# Patient Record
Sex: Male | Born: 1965 | Race: White | Hispanic: No | Marital: Married | Smoking: Never smoker
Health system: Southern US, Community
[De-identification: ages and names within clinical notes are randomized; demographics above are authoritative.]

## PROBLEM LIST (undated history)

## (undated) DIAGNOSIS — I10 Essential (primary) hypertension: Secondary | ICD-10-CM

## (undated) HISTORY — PX: NO PAST SURGERIES: SHX2092

## (undated) HISTORY — DX: Essential (primary) hypertension: I10

---

## 2014-01-26 LAB — HEPATIC FUNCTION PANEL
ALT: 29 U/L (ref 10–40)
AST: 20 U/L (ref 14–40)
Alkaline Phosphatase: 73 U/L (ref 25–125)
BILIRUBIN, TOTAL: 0.5 mg/dL

## 2014-01-26 LAB — BASIC METABOLIC PANEL
BUN: 9 mg/dL (ref 4–21)
Creatinine: 1 mg/dL (ref <=1.3)
Glucose: 100 mg/dL
Potassium: 4.3 mmol/L (ref 3.4–5.3)
SODIUM: 139 mmol/L (ref 137–147)

## 2014-01-26 LAB — CBC AND DIFFERENTIAL
HCT: 42 % (ref 41–53)
HEMOGLOBIN: 14.7 g/dL (ref 13.5–17.5)
Platelets: 230 10*3/uL (ref 150–399)
WBC: 6.9 10^3/mL

## 2014-01-27 ENCOUNTER — Ambulatory Visit: Payer: Self-pay | Admitting: Family Medicine

## 2014-01-27 IMAGING — US ABDOMEN ULTRASOUND
1 series · 14 of 25 positions shown · non-contrast
Comparison: None.

CLINICAL DATA: Epigastric abdominal pain for 10 days.

EXAM:
ULTRASOUND ABDOMEN COMPLETE

[Series 1: abdomen ultrasound · 0.40mm/px · 14 of 89 slices shown]
[im 1/89]
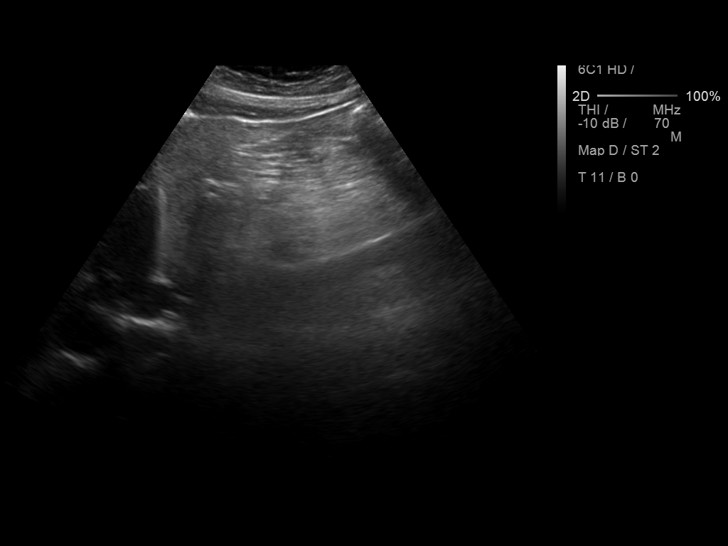
[im 8/89]
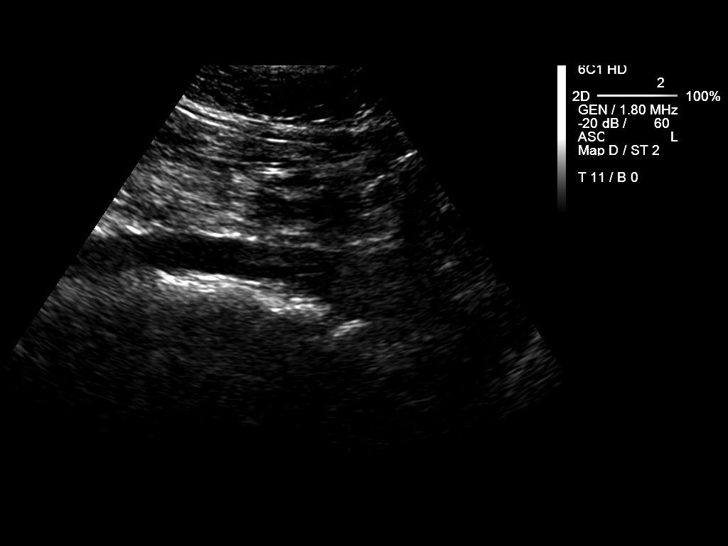
[im 15/89]
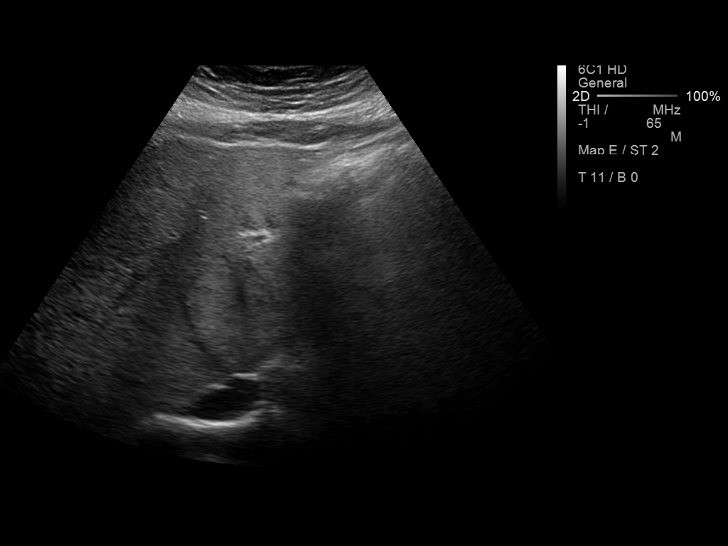
[im 23/89]
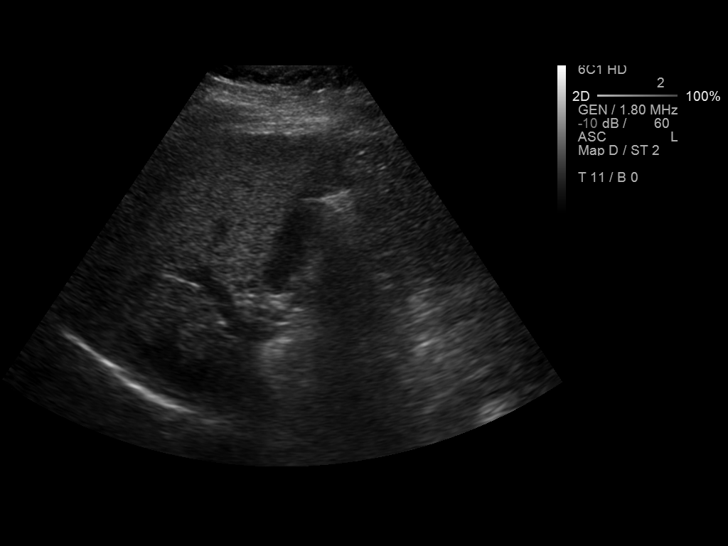
[im 30/89]
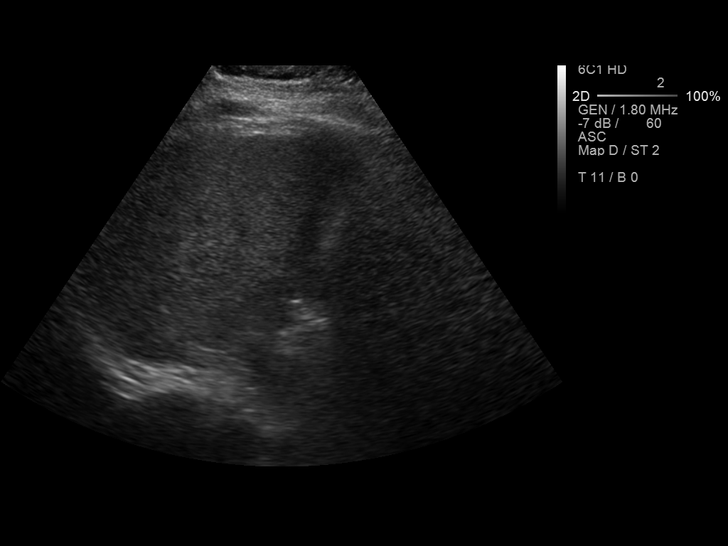
[im 34/89]
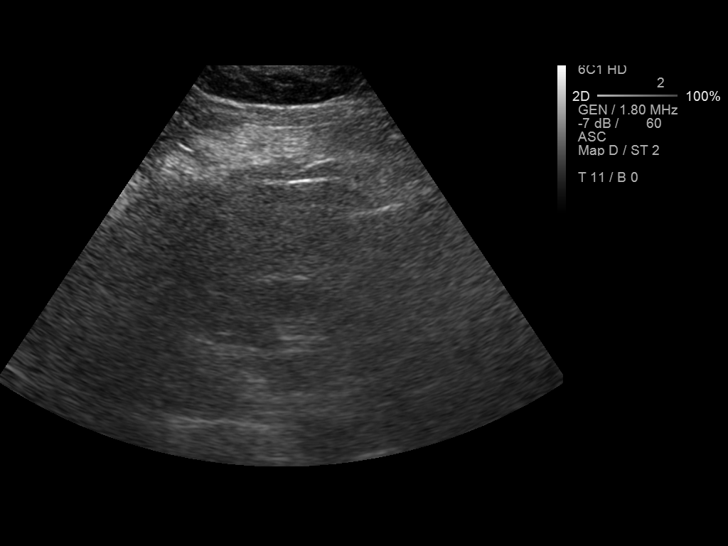
[im 41/89]
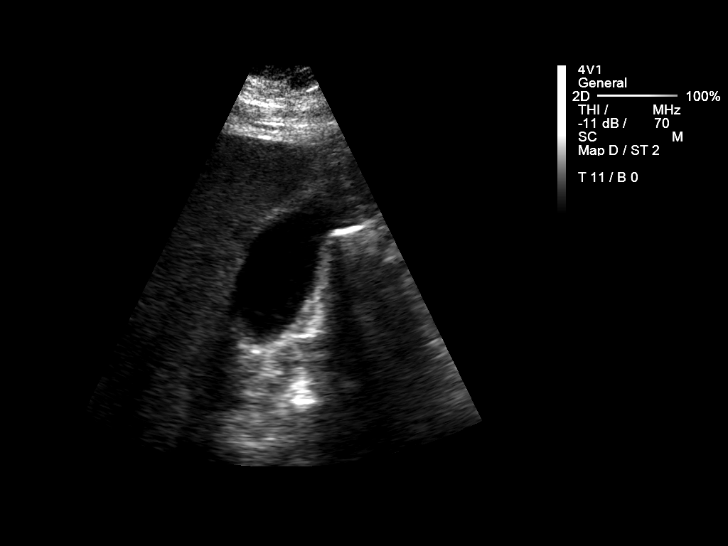
[im 48/89]
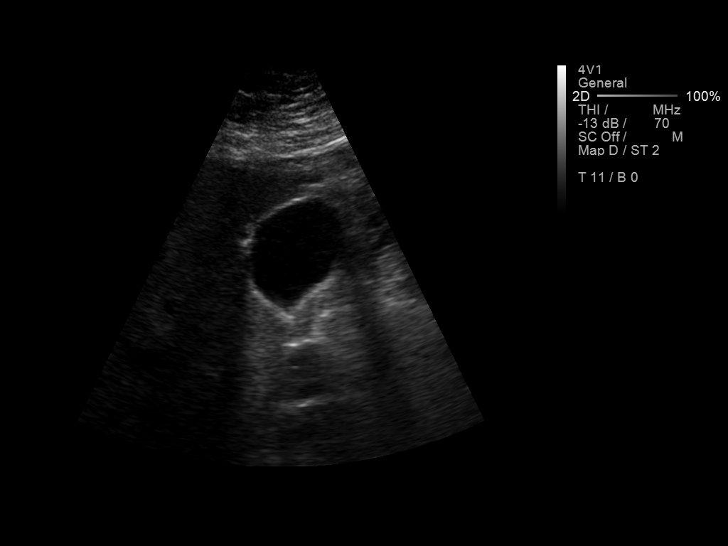
[im 56/89]
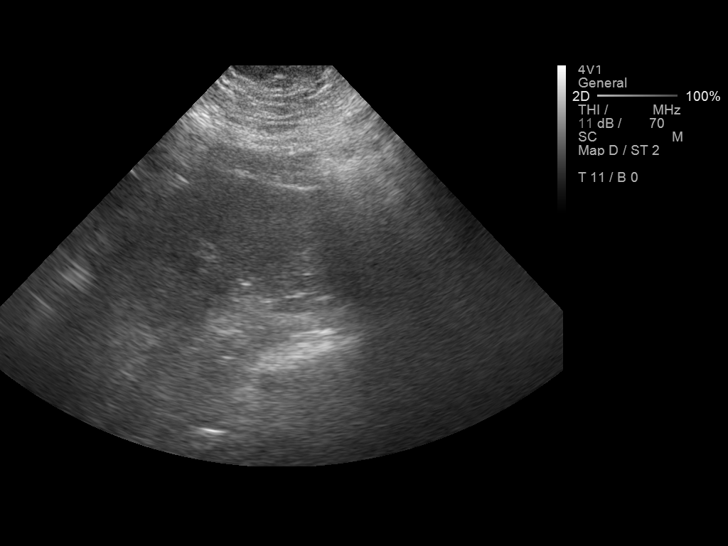
[im 59/89]
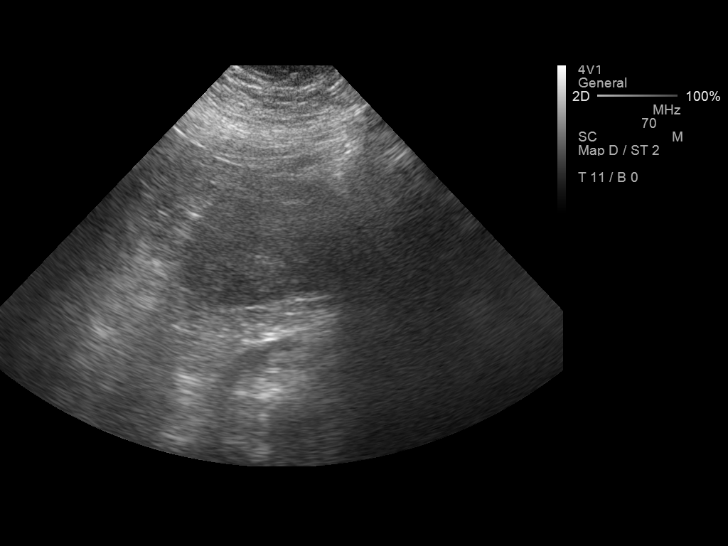
[im 67/89]
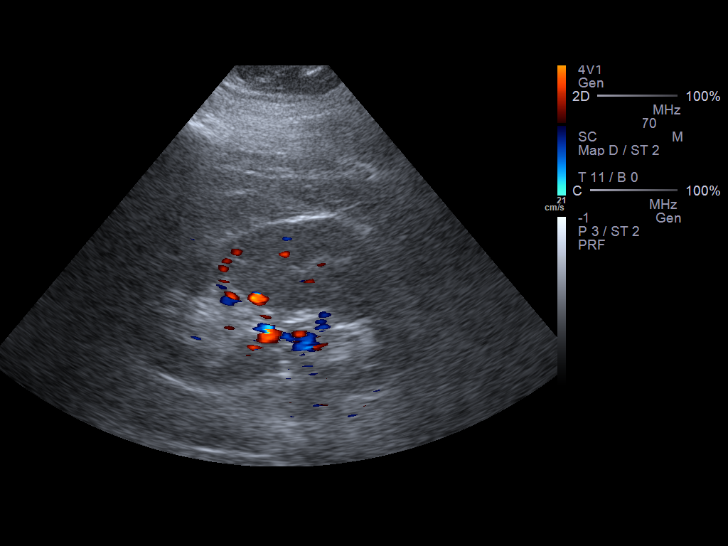
[im 74/89]
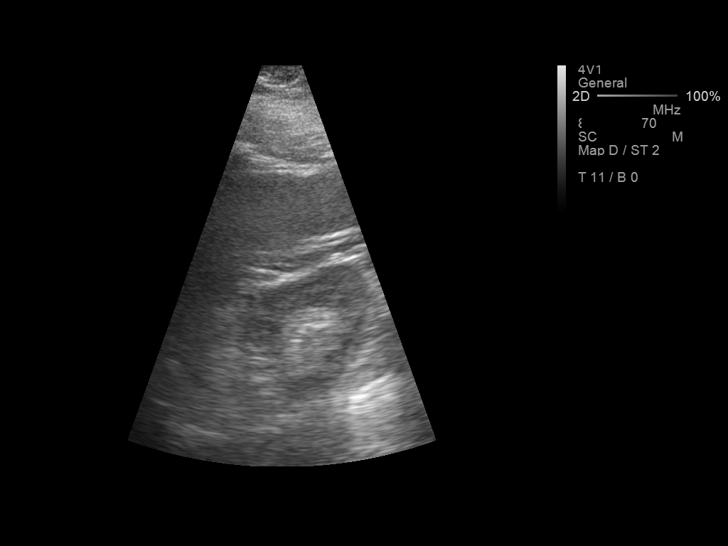
[im 81/89]
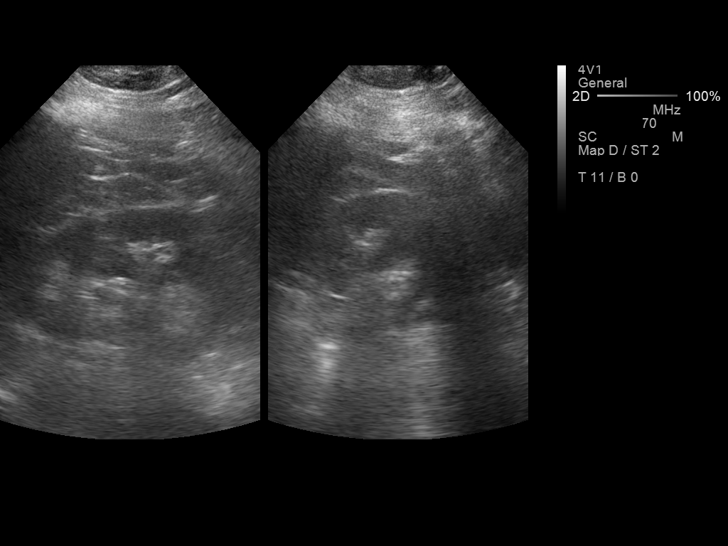
[im 89/89]
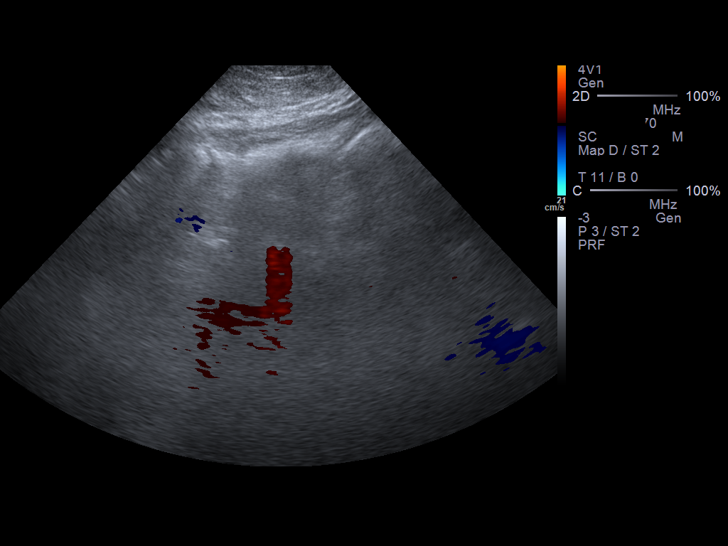

[14 of 25 positions shown; findings below may reference images not displayed]

FINDINGS: Gallbladder: No gallstones or wall thickening visualized. No
sonographic Murphy sign noted.

Common bile duct: Diameter: 3.9 mm

Liver: There is diffuse increased echogenicity of the liver and
decreased through transmission consistent with fatty infiltration.
No focal lesions or biliary dilatation.

IVC: Normal caliber

Pancreas: Poorly visualized due to poor sonographic window.

Spleen: Normal size and echogenicity without focal lesions.

Right Kidney: Length: 11.4 cm. Normal renal cortical thickness and
echogenicity without focal lesions or hydronephrosis.

Left Kidney: Length: 11.4 cm. Normal renal cortical thickness and
echogenicity without focal lesions or hydronephrosis. Suspect
midpole renal calculus.

Abdominal aorta: Normal caliber.

Other findings: None.
IMPRESSION: 1. Normal gallbladder and normal caliber common bile duct.
2. Diffuse fatty infiltration of the liver.
3. Poor visualization of the pancreas and limited visualization of
the aorta.
4. Suspect mid pole left renal calculus.

## 2014-07-21 DIAGNOSIS — R1013 Epigastric pain: Secondary | ICD-10-CM | POA: Insufficient documentation

## 2014-07-21 DIAGNOSIS — I10 Essential (primary) hypertension: Secondary | ICD-10-CM | POA: Insufficient documentation

## 2014-09-10 ENCOUNTER — Ambulatory Visit (INDEPENDENT_AMBULATORY_CARE_PROVIDER_SITE_OTHER): Payer: BLUE CROSS/BLUE SHIELD | Admitting: Family Medicine

## 2014-09-10 ENCOUNTER — Encounter: Payer: Self-pay | Admitting: Family Medicine

## 2014-09-10 VITALS — BP 158/92 | HR 80 | Temp 98.0°F | Resp 16 | Wt 299.2 lb

## 2014-09-10 DIAGNOSIS — E669 Obesity, unspecified: Secondary | ICD-10-CM | POA: Diagnosis not present

## 2014-09-10 DIAGNOSIS — I10 Essential (primary) hypertension: Secondary | ICD-10-CM

## 2014-09-10 NOTE — Patient Instructions (Addendum)
Calorie Counting for Weight Loss Calories are energy you get from the things you eat and drink. Your body uses this energy to keep you going throughout the day. The number of calories you eat affects your weight. When you eat more calories than your body needs, your body stores the extra calories as fat. When you eat fewer calories than your body needs, your body burns fat to get the energy it needs. Calorie counting means keeping track of how many calories you eat and drink each day. If you make sure to eat fewer calories than your body needs, you should lose weight. In order for calorie counting to work, you will need to eat the number of calories that are right for you in a day to lose a healthy amount of weight per week. A healthy amount of weight to lose per week is usually 1-2 lb (0.5-0.9 kg). A dietitian can determine how many calories you need in a day and give you suggestions on how to reach your calorie goal.  WHAT IS MY MY PLAN? My goal is to have __2000________ calories per day.  If I have this many calories per day, I should lose around _____1-2_____ pounds per week. WHAT DO I NEED TO KNOW ABOUT CALORIE COUNTING? In order to meet your daily calorie goal, you will need to:  Find out how many calories are in each food you would like to eat. Try to do this before you eat.  Decide how much of the food you can eat.  Write down what you ate and how many calories it had. Doing this is called keeping a food log. WHERE DO I FIND CALORIE INFORMATION? The number of calories in a food can be found on a Nutrition Facts label. Note that all the information on a label is based on a specific serving of the food. If a food does not have a Nutrition Facts label, try to look up the calories online or ask your dietitian for help. HOW DO I DECIDE HOW MUCH TO EAT? To decide how much of the food you can eat, you will need to consider both the number of calories in one serving and the size of one serving. This  information can be found on the Nutrition Facts label. If a food does not have a Nutrition Facts label, look up the information online or ask your dietitian for help. Remember that calories are listed per serving. If you choose to have more than one serving of a food, you will have to multiply the calories per serving by the amount of servings you plan to eat. For example, the label on a package of bread might say that a serving size is 1 slice and that there are 90 calories in a serving. If you eat 1 slice, you will have eaten 90 calories. If you eat 2 slices, you will have eaten 180 calories. HOW DO I KEEP A FOOD LOG? After each meal, record the following information in your food log:  What you ate.  How much of it you ate.  How many calories it had.  Then, add up your calories. Keep your food log near you, such as in a small notebook in your pocket. Another option is to use a mobile app or website. Some programs will calculate calories for you and show you how many calories you have left each time you add an item to the log. WHAT ARE SOME CALORIE COUNTING TIPS?  Use your calories on foods  and drinks that will fill you up and not leave you hungry. Some examples of this include foods like nuts and nut butters, vegetables, lean proteins, and high-fiber foods (more than 5 g fiber per serving).  Eat nutritious foods and avoid empty calories. Empty calories are calories you get from foods or beverages that do not have many nutrients, such as candy and soda. It is better to have a nutritious high-calorie food (such as an avocado) than a food with few nutrients (such as a bag of chips).  Know how many calories are in the foods you eat most often. This way, you do not have to look up how many calories they have each time you eat them.  Look out for foods that may seem like low-calorie foods but are really high-calorie foods, such as baked goods, soda, and fat-free candy.  Pay attention to calories  in drinks. Drinks such as sodas, specialty coffee drinks, alcohol, and juices have a lot of calories yet do not fill you up. Choose low-calorie drinks like water and diet drinks.  Focus your calorie counting efforts on higher calorie items. Logging the calories in a garden salad that contains only vegetables is less important than calculating the calories in a milk shake.  Find a way of tracking calories that works for you. Get creative. Most people who are successful find ways to keep track of how much they eat in a day, even if they do not count every calorie. WHAT ARE SOME PORTION CONTROL TIPS?  Know how many calories are in a serving. This will help you know how many servings of a certain food you can have.  Use a measuring cup to measure serving sizes. This is helpful when you start out. With time, you will be able to estimate serving sizes for some foods.  Take some time to put servings of different foods on your favorite plates, bowls, and cups so you know what a serving looks like.  Try not to eat straight from a bag or box. Doing this can lead to overeating. Put the amount you would like to eat in a cup or on a plate to make sure you are eating the right portion.  Use smaller plates, glasses, and bowls to prevent overeating. This is a quick and easy way to practice portion control. If your plate is smaller, less food can fit on it.  Try not to multitask while eating, such as watching TV or using your computer. If it is time to eat, sit down at a table and enjoy your food. Doing this will help you to start recognizing when you are full. It will also make you more aware of what and how much you are eating. HOW CAN I CALORIE COUNT WHEN EATING OUT?  Ask for smaller portion sizes or child-sized portions.  Consider sharing an entree and sides instead of getting your own entree.  If you get your own entree, eat only half. Ask for a box at the beginning of your meal and put the rest of your  entree in it so you are not tempted to eat it.  Look for the calories on the menu. If calories are listed, choose the lower calorie options.  Choose dishes that include vegetables, fruits, whole grains, low-fat dairy products, and lean protein. Focusing on smart food choices from each of the 5 food groups can help you stay on track at restaurants.  Choose items that are boiled, broiled, grilled, or steamed.  Choose   water, milk, unsweetened iced tea, or other drinks without added sugars. If you want an alcoholic beverage, choose a lower calorie option. For example, a regular margarita can have up to 700 calories and a glass of wine has around 150.  Stay away from items that are buttered, battered, fried, or served with cream sauce. Items labeled "crispy" are usually fried, unless stated otherwise.  Ask for dressings, sauces, and syrups on the side. These are usually very high in calories, so do not eat much of them.  Watch out for salads. Many people think salads are a healthy option, but this is often not the case. Many salads come with bacon, fried chicken, lots of cheese, fried chips, and dressing. All of these items have a lot of calories. If you want a salad, choose a garden salad and ask for grilled meats or steak. Ask for the dressing on the side, or ask for olive oil and vinegar or lemon to use as dressing.  Estimate how many servings of a food you are given. For example, a serving of cooked rice is  cup or about the size of half a tennis ball or one cupcake wrapper. Knowing serving sizes will help you be aware of how much food you are eating at restaurants. The list below tells you how big or small some common portion sizes are based on everyday objects.  1 oz--4 stacked dice.  3 oz--1 deck of cards.  1 tsp--1 dice.  1 Tbsp-- a Ping-Pong ball.  2 Tbsp--1 Ping-Pong ball.   cup--1 tennis ball or 1 cupcake wrapper.  1 cup--1 baseball. Document Released: 03/12/2005 Document  Revised: 07/27/2013 Document Reviewed: 01/15/2013 [facility] Patient Information 2015 [place], [place]. This information is not intended to replace advice given to you by your health care provider. Make sure you discuss any questions you have with your health care provider. Exercise to Lose Weight Exercise and a healthy diet may help you lose weight. Your doctor may suggest specific exercises. EXERCISE IDEAS AND TIPS  Choose low-cost things you enjoy doing, such as walking, bicycling, or exercising to workout videos.  Take stairs instead of the elevator.  Walk during your lunch break.  Park your car further away from work or school.  Go to a gym or an exercise class.  Start with 5 to 10 minutes of exercise each day. Build up to 30 minutes of exercise 4 to 6 days a week.  Wear shoes with good support and comfortable clothes.  Stretch before and after working out.  Work out until you breathe harder and your heart beats faster.  Drink extra water when you exercise.  Do not do so much that you hurt yourself, feel dizzy, or get very short of breath. Exercises that burn about 150 calories:  Running 1  miles in 15 minutes.  Playing volleyball for 45 to 60 minutes.  Washing and waxing a car for 45 to 60 minutes.  Playing touch football for 45 minutes.  Walking 1  miles in 35 minutes.  Pushing a stroller 1  miles in 30 minutes.  Playing basketball for 30 minutes.  Raking leaves for 30 minutes.  Bicycling 5 miles in 30 minutes.  Walking 2 miles in 30 minutes.  Dancing for 30 minutes.  Shoveling snow for 15 minutes.  Swimming laps for 20 minutes.  Walking up stairs for 15 minutes.  Bicycling 4 miles in 15 minutes.  Gardening for 30 to 45 minutes.  Jumping rope for 15 minutes.  Washing  windows or floors for 45 to 60 minutes. Document Released: 04/14/2010 Document Revised: 06/04/2011 Document Reviewed: 04/14/2010 [facility] Patient Information 2015 [place], [place].  This information is not intended to replace advice given to you by your health care provider. Make sure you discuss any questions you have with your health care provider.

## 2014-09-10 NOTE — Progress Notes (Signed)
   Subjective:    Patient ID: Travis Tanner, male    DOB: Mar 19, 1966, 49 y.o.   MRN: [id number]  Chief Complaint  Patient presents with  . Hypertension    3 mo follow up    HPI  Has been taking Lisinopril/HCTZ 10/12.5 mg qd on a regular basis. No side effects. Feels it was working well until he gained 11 lbs over the past 20-30 days. States family activities (ball games late in the evening) has changed his eating habits to more fast foods and this has caused his BP to go up. Exercise level is minimal. Patient Active Problem List   Diagnosis Date Noted  . Abdominal pain, epigastric 07/21/2014  . Essential (primary) hypertension 07/21/2014   History  Substance Use Topics  . Smoking status: Never Smoker   . Smokeless tobacco: Not on file  . Alcohol Use: 0.0 oz/week    0 Standard drinks or equivalent per week     Comment: occasionally- drinks twice a day and drinks 4-6 beers   Past Surgical History  Procedure Laterality Date  . No past surgeries     Current Outpatient Prescriptions on File Prior to Visit  Medication Sig Dispense Refill  . lisinopril-hydrochlorothiazide (PRINZIDE,ZESTORETIC) 10-12.5 MG per tablet Take 1 tablet by mouth daily.     No current facility-administered medications on file prior to visit.   No Known Allergies  Review of Systems  Constitutional: Negative.   HENT: Negative.   Eyes: Negative.   Cardiovascular: Negative.   Gastrointestinal: Negative.   Endocrine: Negative.   Genitourinary: Negative.        Objective:   Physical Exam  Constitutional: He is oriented to person, place, and time. He appears well-developed and well-nourished. No distress.  HENT:  Head: Normocephalic and atraumatic.  Right Ear: Hearing normal.  Left Ear: Hearing normal.  Nose: Nose normal.  Eyes: Conjunctivae and lids are normal. Right eye exhibits no discharge. Left eye exhibits no discharge. No scleral icterus.  Cardiovascular: Normal rate, regular rhythm and normal  heart sounds.   Pulmonary/Chest: Effort normal. No respiratory distress.  Abdominal: Soft. Bowel sounds are normal.  Morbid obesity  Musculoskeletal: Normal range of motion.  Neurological: He is alert and oriented to person, place, and time.  Skin: Skin is intact. No lesion and no rash noted.  Psychiatric: He has a normal mood and affect. His speech is normal and behavior is normal. Thought content normal.   BP 158/92 mmHg  Pulse 80  Temp(Src) 98 F (36.7 C) (Oral)  Resp 16  Wt 299 lb 3.2 oz (135.716 kg)        Assessment & Plan:  1. Essential (primary) hypertension Tolerating Lisinopril/HCTZ dosage increase at last office visit. Was doing better until recent weight gain. Will schedule follow up in 2 months and continue present dosage of BP meds. Limit sodium intake, work on weight loss and will get labs prior to next office visit. - CBC with Differential; Future - Comprehensive metabolic panel; Future  2. Obesity Gained 11 lbs since last office visit 3 months ago. Given calorie counting 2000 calorie diet and exercise regimen.

## 2014-10-03 ENCOUNTER — Other Ambulatory Visit: Payer: Self-pay | Admitting: Family Medicine

## 2014-11-11 ENCOUNTER — Ambulatory Visit: Payer: BLUE CROSS/BLUE SHIELD | Admitting: Family Medicine

## 2014-11-25 ENCOUNTER — Encounter: Payer: Self-pay | Admitting: Family Medicine

## 2014-11-25 ENCOUNTER — Ambulatory Visit (INDEPENDENT_AMBULATORY_CARE_PROVIDER_SITE_OTHER): Payer: BLUE CROSS/BLUE SHIELD | Admitting: Family Medicine

## 2014-11-25 VITALS — BP 148/94 | HR 80 | Temp 98.7°F | Resp 16 | Ht 70.5 in | Wt 298.4 lb

## 2014-11-25 DIAGNOSIS — Z6841 Body Mass Index (BMI) 40.0 and over, adult: Secondary | ICD-10-CM

## 2014-11-25 DIAGNOSIS — I10 Essential (primary) hypertension: Secondary | ICD-10-CM | POA: Diagnosis not present

## 2014-11-25 MED ORDER — LISINOPRIL-HYDROCHLOROTHIAZIDE 20-12.5 MG PO TABS: 1.0000 | ORAL_TABLET | Freq: Every day | ORAL | Status: DC

## 2014-11-25 NOTE — Progress Notes (Signed)
Patient ID: Travis Tanner, male   DOB: 03/01/1966, 49 y.o.   MRN: [id number]       Patient: Travis Tanner Male    DOB: 1965/10/16   49 y.o.   MRN: [id number] Visit Date: 11/25/2014  Today's Provider: Dortha Kern, PA   Chief Complaint  Patient presents with  . Hypertension    follow-up, started on lisinopril/hztc B/P agents,  158/92 on 09/10/2014, pt states that he takes his b/p in the evenings.   Subjective:    Hypertension This is a new problem. The current episode started more than 1 month ago. The problem has been gradually improving since onset. Pertinent negatives include no anxiety, blurred vision, chest pain, headaches, neck pain, palpitations, shortness of breath or sweats. Treatments tried: pt on lisinopril/hztc agents.  Trying to improve diet and walking for 20 minutes 3 times a week for exercise.   No Known Allergies Previous Medications   LISINOPRIL-HYDROCHLOROTHIAZIDE (PRINZIDE,ZESTORETIC) 10-12.5 MG PER TABLET    TAKE 1 TABLET BY MOUTH EVERY DAY    Review of Systems  Eyes: Negative for blurred vision.  Respiratory: Negative for shortness of breath.   Cardiovascular: Negative for chest pain and palpitations.  Musculoskeletal: Negative for neck pain.  Neurological: Negative for headaches.    Social History  Substance Use Topics  . Smoking status: Never Smoker   . Smokeless tobacco: Not on file  . Alcohol Use: 0.0 oz/week    0 Standard drinks or equivalent per week     Comment: occasionally- drinks twice a day and drinks 4-6 beers   Objective:   BP 148/94 mmHg  Pulse 80  Temp(Src) 98.7 F (37.1 C) (Oral)  Resp 16  Wt 298 lb 6.4 oz (135.353 kg)  SpO2 96% Body mass index is 42.2 kg/(m^2).  Physical Exam  Constitutional: He is oriented to person, place, and time. He appears well-developed and well-nourished. No distress.  HENT:  Head: Normocephalic and atraumatic.  Right Ear: Hearing normal.  Left Ear: Hearing normal.  Nose: Nose normal.  Eyes:  Conjunctivae, EOM and lids are normal. Right eye exhibits no discharge. Left eye exhibits no discharge. No scleral icterus.  Neck: Neck supple.  Cardiovascular: Normal rate, regular rhythm and normal heart sounds.   Pulmonary/Chest: Effort normal and breath sounds normal. No respiratory distress.  Abdominal: Soft. Bowel sounds are normal.  Musculoskeletal: Normal range of motion.  Neurological: He is alert and oriented to person, place, and time.  Skin: Skin is intact. No lesion and no rash noted.  Psychiatric: He has a normal mood and affect. His speech is normal and behavior is normal. Thought content normal.      Assessment & Plan:     1. Essential (primary) hypertension Tolerating Lisinopril with HCTZ but BP still high. Has not accomplished labs tests yet. Will reorder and increase Lisinopril/HCTZ to 20/12.5 mg qd and recheck pending reports. - COMPLETE METABOLIC PANEL WITH GFR - CBC with Differential/Platelet - TSH - Lipid panel  2. BMI 40.0-44.9, adult Trying to improve diet and walking for exercise. Must lose some weight to improve BP control. Will check routine labs. - COMPLETE METABOLIC PANEL WITH GFR - CBC with Differential/Platelet - TSH       Dortha Kern, PA  [facility]

## 2014-11-25 NOTE — Patient Instructions (Signed)
Exercise to Lose Weight Exercise and a healthy diet may help you lose weight. Your doctor may suggest specific exercises. EXERCISE IDEAS AND TIPS  Choose low-cost things you enjoy doing, such as walking, bicycling, or exercising to workout videos.  Take stairs instead of the elevator.  Walk during your lunch break.  Park your car further away from work or school.  Go to a gym or an exercise class.  Start with 5 to 10 minutes of exercise each day. Build up to 30 minutes of exercise 4 to 6 days a week.  Wear shoes with good support and comfortable clothes.  Stretch before and after working out.  Work out until you breathe harder and your heart beats faster.  Drink extra water when you exercise.  Do not do so much that you hurt yourself, feel dizzy, or get very short of breath. Exercises that burn about 150 calories:  Running 1  miles in 15 minutes.  Playing volleyball for 45 to 60 minutes.  Washing and waxing a car for 45 to 60 minutes.  Playing touch football for 45 minutes.  Walking 1  miles in 35 minutes.  Pushing a stroller 1  miles in 30 minutes.  Playing basketball for 30 minutes.  Raking leaves for 30 minutes.  Bicycling 5 miles in 30 minutes.  Walking 2 miles in 30 minutes.  Dancing for 30 minutes.  Shoveling snow for 15 minutes.  Swimming laps for 20 minutes.  Walking up stairs for 15 minutes.  Bicycling 4 miles in 15 minutes.  Gardening for 30 to 45 minutes.  Jumping rope for 15 minutes.  Washing windows or floors for 45 to 60 minutes. Document Released: 04/14/2010 Document Revised: 06/04/2011 Document Reviewed: 04/14/2010 Exit[employer] Patient Information 2015 [employer]. This information is not intended to replace advice given to you by your health care provider. Make sure you discuss any questions you have with your health care provider. Calorie Counting for Weight Loss Calories are energy you get from the things you eat and drink. Your  body uses this energy to keep you going throughout the day. The number of calories you eat affects your weight. When you eat more calories than your body needs, your body stores the extra calories as fat. When you eat fewer calories than your body needs, your body burns fat to get the energy it needs. Calorie counting means keeping track of how many calories you eat and drink each day. If you make sure to eat fewer calories than your body needs, you should lose weight. In order for calorie counting to work, you will need to eat the number of calories that are right for you in a day to lose a healthy amount of weight per week. A healthy amount of weight to lose per week is usually 1-2 lb (0.5-0.9 kg). A dietitian can determine how many calories you need in a day and give you suggestions on how to reach your calorie goal.  WHAT IS MY MY PLAN? My goal is to have __________ calories per day.  If I have this many calories per day, I should lose around __________ pounds per week. WHAT DO I NEED TO KNOW ABOUT CALORIE COUNTING? In order to meet your daily calorie goal, you will need to:  Find out how many calories are in each food you would like to eat. Try to do this before you eat.  Decide how much of the food you can eat.  Write down what you ate and   how many calories it had. Doing this is called keeping a food log. WHERE DO I FIND CALORIE INFORMATION? The number of calories in a food can be found on a Nutrition Facts label. Note that all the information on a label is based on a specific serving of the food. If a food does not have a Nutrition Facts label, try to look up the calories online or ask your dietitian for help. HOW DO I DECIDE HOW MUCH TO EAT? To decide how much of the food you can eat, you will need to consider both the number of calories in one serving and the size of one serving. This information can be found on the Nutrition Facts label. If a food does not have a Nutrition Facts label, look  up the information online or ask your dietitian for help. Remember that calories are listed per serving. If you choose to have more than one serving of a food, you will have to multiply the calories per serving by the amount of servings you plan to eat. For example, the label on a package of bread might say that a serving size is 1 slice and that there are 90 calories in a serving. If you eat 1 slice, you will have eaten 90 calories. If you eat 2 slices, you will have eaten 180 calories. HOW DO I KEEP A FOOD LOG? After each meal, record the following information in your food log:  What you ate.  How much of it you ate.  How many calories it had.  Then, add up your calories. Keep your food log near you, such as in a small notebook in your pocket. Another option is to use a mobile app or website. Some programs will calculate calories for you and show you how many calories you have left each time you add an item to the log. WHAT ARE SOME CALORIE COUNTING TIPS?  Use your calories on foods and drinks that will fill you up and not leave you hungry. Some examples of this include foods like nuts and nut butters, vegetables, lean proteins, and high-fiber foods (more than 5 g fiber per serving).  Eat nutritious foods and avoid empty calories. Empty calories are calories you get from foods or beverages that do not have many nutrients, such as candy and soda. It is better to have a nutritious high-calorie food (such as an avocado) than a food with few nutrients (such as a bag of chips).  Know how many calories are in the foods you eat most often. This way, you do not have to look up how many calories they have each time you eat them.  Look out for foods that may seem like low-calorie foods but are really high-calorie foods, such as baked goods, soda, and fat-free candy.  Pay attention to calories in drinks. Drinks such as sodas, specialty coffee drinks, alcohol, and juices have a lot of calories yet do  not fill you up. Choose low-calorie drinks like water and diet drinks.  Focus your calorie counting efforts on higher calorie items. Logging the calories in a garden salad that contains only vegetables is less important than calculating the calories in a milk shake.  Find a way of tracking calories that works for you. Get creative. Most people who are successful find ways to keep track of how much they eat in a day, even if they do not count every calorie. WHAT ARE SOME PORTION CONTROL TIPS?  Know how many calories are in a   serving. This will help you know how many servings of a certain food you can have.  Use a measuring cup to measure serving sizes. This is helpful when you start out. With time, you will be able to estimate serving sizes for some foods.  Take some time to put servings of different foods on your favorite plates, bowls, and cups so you know what a serving looks like.  Try not to eat straight from a bag or box. Doing this can lead to overeating. Put the amount you would like to eat in a cup or on a plate to make sure you are eating the right portion.  Use smaller plates, glasses, and bowls to prevent overeating. This is a quick and easy way to practice portion control. If your plate is smaller, less food can fit on it.  Try not to multitask while eating, such as watching TV or using your computer. If it is time to eat, sit down at a table and enjoy your food. Doing this will help you to start recognizing when you are full. It will also make you more aware of what and how much you are eating. HOW CAN I CALORIE COUNT WHEN EATING OUT?  Ask for smaller portion sizes or child-sized portions.  Consider sharing an entree and sides instead of getting your own entree.  If you get your own entree, eat only half. Ask for a box at the beginning of your meal and put the rest of your entree in it so you are not tempted to eat it.  Look for the calories on the menu. If calories are listed,  choose the lower calorie options.  Choose dishes that include vegetables, fruits, whole grains, low-fat dairy products, and lean protein. Focusing on smart food choices from each of the 5 food groups can help you stay on track at restaurants.  Choose items that are boiled, broiled, grilled, or steamed.  Choose water, milk, unsweetened iced tea, or other drinks without added sugars. If you want an alcoholic beverage, choose a lower calorie option. For example, a regular margarita can have up to 700 calories and a glass of wine has around 150.  Stay away from items that are buttered, battered, fried, or served with cream sauce. Items labeled "crispy" are usually fried, unless stated otherwise.  Ask for dressings, sauces, and syrups on the side. These are usually very high in calories, so do not eat much of them.  Watch out for salads. Many people think salads are a healthy option, but this is often not the case. Many salads come with bacon, fried chicken, lots of cheese, fried chips, and dressing. All of these items have a lot of calories. If you want a salad, choose a garden salad and ask for grilled meats or steak. Ask for the dressing on the side, or ask for olive oil and vinegar or lemon to use as dressing.  Estimate how many servings of a food you are given. For example, a serving of cooked rice is  cup or about the size of half a tennis ball or one cupcake wrapper. Knowing serving sizes will help you be aware of how much food you are eating at restaurants. The list below tells you how big or small some common portion sizes are based on everyday objects.  1 oz--4 stacked dice.  3 oz--1 deck of cards.  1 tsp--1 dice.  1 Tbsp-- a Ping-Pong ball.  2 Tbsp--1 Ping-Pong ball.   cup--1 tennis ball   or 1 cupcake wrapper.  1 cup--1 baseball. Document Released: 03/12/2005 Document Revised: 07/27/2013 Document Reviewed: 01/15/2013 Exit[employer] Patient Information 2015 [employer]. This  information is not intended to replace advice given to you by your health care provider. Make sure you discuss any questions you have with your health care provider.  

## 2015-03-10 ENCOUNTER — Ambulatory Visit: Payer: BLUE CROSS/BLUE SHIELD | Admitting: Family Medicine

## 2015-03-31 ENCOUNTER — Other Ambulatory Visit: Payer: Self-pay | Admitting: Family Medicine

## 2015-04-08 ENCOUNTER — Encounter: Payer: Self-pay | Admitting: Family Medicine

## 2015-04-08 ENCOUNTER — Ambulatory Visit (INDEPENDENT_AMBULATORY_CARE_PROVIDER_SITE_OTHER): Payer: BLUE CROSS/BLUE SHIELD | Admitting: Family Medicine

## 2015-04-08 VITALS — BP 142/84 | HR 92 | Temp 98.1°F | Resp 16 | Wt 299.2 lb

## 2015-04-08 DIAGNOSIS — I10 Essential (primary) hypertension: Secondary | ICD-10-CM | POA: Diagnosis not present

## 2015-04-08 DIAGNOSIS — Z6841 Body Mass Index (BMI) 40.0 and over, adult: Secondary | ICD-10-CM

## 2015-04-08 NOTE — Progress Notes (Signed)
Patient ID: Travis Tanner, male   DOB: [birth year], 50 y.o.   MRN: [id number]   Patient: Travis Tanner Male    DOB: 1965-04-25   50 y.o.   MRN: [id number] Visit Date: 04/08/2015  Today's Provider: Dortha Kern, PA   Chief Complaint  Patient presents with  . Hypertension  . Follow-up   Subjective:    Hypertension This is a chronic problem. The current episode started more than 1 month ago. The problem has been gradually improving since onset. The problem is controlled. There are no associated agents to hypertension. Risk factors for coronary artery disease include obesity and male gender. Past treatments include ACE inhibitors and diuretics. Compliance problems include diet and exercise.    Patient Active Problem List   Diagnosis Date Noted  . Abdominal pain, epigastric 07/21/2014  . Essential (primary) hypertension 07/21/2014   Past Surgical History  Procedure Laterality Date  . No past surgeries     Family History  Problem Relation Age of Onset  . Cancer Mother   . Cancer Maternal Grandmother   . Cancer Maternal Grandfather   . Cancer Paternal Grandmother    No Known Allergies   Previous Medications   AMOXICILLIN (AMOXIL) 500 MG CAPSULE    2 STAT THEN TAKE ONE CAPSULE BY MOUTH 3 TIMES A DAY UNTIL GONE   LISINOPRIL-HYDROCHLOROTHIAZIDE (PRINZIDE,ZESTORETIC) 20-12.5 MG TABLET    TAKE 1 TABLET BY MOUTH DAILY.    Review of Systems  Constitutional: Negative.   HENT: Negative.   Eyes: Negative.   Respiratory: Negative.   Cardiovascular: Negative.   Gastrointestinal: Negative.   Endocrine: Negative.   Genitourinary: Negative.   Musculoskeletal: Negative.   Skin: Negative.   Allergic/Immunologic: Negative.   Neurological: Negative.   Hematological: Negative.   Psychiatric/Behavioral: Negative.     Social History  Substance Use Topics  . Smoking status: Never Smoker   . Smokeless tobacco: Not on file  . Alcohol Use: 0.0 oz/week    0 Standard drinks or equivalent per  week     Comment: occasionally- drinks twice a day and drinks 4-6 beers   Objective:   BP 142/84 mmHg  Pulse 92  Temp(Src) 98.1 F (36.7 C) (Oral)  Resp 16  Wt 299 lb 3.2 oz (135.716 kg)  SpO2 97% Body mass index is 42.31 kg/(m^2). Wt Readings from Last 3 Encounters:  04/08/15 299 lb 3.2 oz (135.716 kg)  11/25/14 298 lb 6.4 oz (135.353 kg)  09/10/14 299 lb 3.2 oz (135.716 kg)   BP Readings from Last 3 Encounters:  04/08/15 142/84  11/25/14 148/94  09/10/14 158/92    Physical Exam  Constitutional: He is oriented to person, place, and time. He appears well-developed and well-nourished.  HENT:  Head: Normocephalic.  Eyes: Conjunctivae and EOM are normal.  Neck: Neck supple.  Cardiovascular: Normal rate.   Pulmonary/Chest: Effort normal and breath sounds normal.  Abdominal: Soft. Bowel sounds are normal.  Neurological: He is alert and oriented to person, place, and time.  Psychiatric: He has a normal mood and affect. His behavior is normal.      Assessment & Plan:     1. Essential (primary) hypertension Slight improvement with Lisinopril/HCTZ 20/12.5 mg qd. No side effects. Getting better compliance changing to taking it every morning now. Continue restriction of sodium intake. Recheck in 3 months.  2. Adult BMI 40.0-44.9 kg/sq m [facility]) Has not been on regular exercise with recent winter weather. Diet compliance has not been the best either. Promises to  get back on weight loss program. Did not get labs accomplished in Sept. 2016. Will reorder and recheck pending reports. - CBC with Differential/Platelet; Future - COMPLETE METABOLIC PANEL WITH GFR; Future - Lipid panel; Future - Hemoglobin A1c; Future - TSH; Future

## 2015-06-10 ENCOUNTER — Other Ambulatory Visit: Payer: Self-pay

## 2015-06-10 MED ORDER — LISINOPRIL-HYDROCHLOROTHIAZIDE 20-12.5 MG PO TABS: 1.0000 | ORAL_TABLET | Freq: Every day | ORAL | Status: DC

## 2015-06-10 NOTE — Telephone Encounter (Signed)
Request received from [facility] requesting a 90 day supply of Lisinopril- HCTZ.

## 2015-07-08 ENCOUNTER — Ambulatory Visit: Payer: BLUE CROSS/BLUE SHIELD | Admitting: Family Medicine

## 2015-07-15 ENCOUNTER — Ambulatory Visit (INDEPENDENT_AMBULATORY_CARE_PROVIDER_SITE_OTHER): Payer: BLUE CROSS/BLUE SHIELD | Admitting: Family Medicine

## 2015-07-15 ENCOUNTER — Encounter: Payer: Self-pay | Admitting: Family Medicine

## 2015-07-15 VITALS — BP 140/90 | HR 78 | Temp 98.1°F | Resp 16 | Wt 299.8 lb

## 2015-07-15 DIAGNOSIS — Z6841 Body Mass Index (BMI) 40.0 and over, adult: Secondary | ICD-10-CM | POA: Diagnosis not present

## 2015-07-15 DIAGNOSIS — I10 Essential (primary) hypertension: Secondary | ICD-10-CM

## 2015-07-15 MED ORDER — HYDROCHLOROTHIAZIDE 12.5 MG PO TABS: 12.5000 mg | ORAL_TABLET | Freq: Every day | ORAL | Status: DC

## 2015-07-15 NOTE — Progress Notes (Signed)
Patient ID: Travis Tanner, male   DOB: 09/12/1965, 50 y.o.   MRN: [id number]   Patient: Travis SignsGene D Tanner Male    DOB: 01/29/1966   50 y.o.   MRN: [id number] Visit Date: 07/15/2015  Today's Provider: Dortha Kernennis Chrismon, PA   Chief Complaint  Patient presents with  . Hypertension  . Follow-up   Subjective:    HPI  Hypertension, follow-up:  BP Readings from Last 3 Encounters:  07/15/15 140/90  04/08/15 142/84  11/25/14 148/94    He was last seen for hypertension 3 months ago.  BP at that visit was 142/84. Management changes since that visit include none. He reports good compliance with treatment. He is not having side effects.  He is exercising. He is not adherent to low salt diet.   Outside blood pressures are being checked periodically. He is experiencing none.  Patient denies none.   Cardiovascular risk factors include male gender and obesity (BMI >= 30 kg/m2).  Use of agents associated with hypertension: none.     Weight trend: stable Wt Readings from Last 3 Encounters:  07/15/15 299 lb 12.8 oz (135.988 kg)  04/08/15 299 lb 3.2 oz (135.716 kg)  11/25/14 298 lb 6.4 oz (135.353 kg)    ------------------------------------------------------------------------   History reviewed. No pertinent past medical history. Patient Active Problem List   Diagnosis Date Noted  . Abdominal pain, epigastric 07/21/2014  . Essential (primary) hypertension 07/21/2014   Past Surgical History  Procedure Laterality Date  . No past surgeries     Family History  Problem Relation Age of Onset  . Cancer Mother   . Cancer Maternal Grandmother   . Cancer Maternal Grandfather   . Cancer Paternal Grandmother    Previous Medications   LISINOPRIL-HYDROCHLOROTHIAZIDE (PRINZIDE,ZESTORETIC) 20-12.5 MG TABLET    Take 1 tablet by mouth daily.   No Known Allergies  Review of Systems  Constitutional: Negative.   HENT: Negative.   Eyes: Negative.   Respiratory: Negative.   Cardiovascular: Negative.    Gastrointestinal: Negative.   Endocrine: Negative.   Genitourinary: Negative.   Musculoskeletal: Negative.   Skin: Negative.   Allergic/Immunologic: Negative.   Neurological: Negative.   Hematological: Negative.   Psychiatric/Behavioral: Negative.     Social History  Substance Use Topics  . Smoking status: Never Smoker   . Smokeless tobacco: Not on file  . Alcohol Use: 0.0 oz/week    0 Standard drinks or equivalent per week     Comment: occasionally- drinks twice a day and drinks 4-6 beers   Objective:   BP 140/90 mmHg  Pulse 78  Temp(Src) 98.1 F (36.7 C) (Oral)  Resp 16  Wt 299 lb 12.8 oz (135.988 kg) Body mass index is 42.39 kg/(m^2).   Physical Exam  Constitutional: He is oriented to person, place, and time. He appears well-developed and well-nourished. No distress.  HENT:  Head: Normocephalic and atraumatic.  Right Ear: Hearing normal.  Left Ear: Hearing normal.  Nose: Nose normal.  Eyes: Conjunctivae and lids are normal. Right eye exhibits no discharge. Left eye exhibits no discharge. No scleral icterus.  Cardiovascular: Normal rate and regular rhythm.   Pulmonary/Chest: Effort normal and breath sounds normal. No respiratory distress.  Abdominal: Soft. Bowel sounds are normal.  Musculoskeletal: Normal range of motion.  Neurological: He is alert and oriented to person, place, and time.  Skin: Skin is intact. No lesion and no rash noted.  Psychiatric: He has a normal mood and affect. His speech is normal and behavior  is normal. Thought content normal.      Assessment & Plan:     1. Essential (primary) hypertension Feeling well and no additional weight gain. Tolerating Lisinopril/HCTZ 20/12.5 mg qd. BP still elevated above 140/90 even at home. Will add more HCTZ and get routine labs. Recheck in 3 months. - hydrochlorothiazide (HYDRODIURIL) 12.5 MG tablet; Take 1 tablet (12.5 mg total) by mouth daily.  Dispense: 90 tablet; Refill: 0  2. Adult BMI 40.0-44.9  kg/sq m (HCC) Stress at work and home have made dieting and extra exercise a problem. Forgot to get labs in January. Wants requisition printed out to get it done Monday.  - CBC with Differential/Platelet - COMPLETE METABOLIC PANEL WITH GFR - Lipid panel - Hemoglobin A1c - TSH

## 2015-10-13 ENCOUNTER — Other Ambulatory Visit: Payer: Self-pay | Admitting: Family Medicine

## 2015-10-14 ENCOUNTER — Ambulatory Visit: Payer: BLUE CROSS/BLUE SHIELD | Admitting: Family Medicine

## 2015-11-04 ENCOUNTER — Ambulatory Visit: Payer: BLUE CROSS/BLUE SHIELD | Admitting: Family Medicine

## 2016-01-09 ENCOUNTER — Other Ambulatory Visit: Payer: Self-pay | Admitting: Family Medicine

## 2016-04-10 ENCOUNTER — Other Ambulatory Visit: Payer: Self-pay | Admitting: Family Medicine

## 2016-04-10 NOTE — Telephone Encounter (Signed)
Ok to refill 

## 2016-05-10 ENCOUNTER — Telehealth: Payer: Self-pay

## 2016-05-10 NOTE — Telephone Encounter (Signed)
Can get 90 day refill when follow up appointment and lab tests accomplished.

## 2016-05-10 NOTE — Telephone Encounter (Signed)
LMTCB

## 2016-05-10 NOTE — Telephone Encounter (Signed)
Refill request received from [facility] requesting a 90 day supply on HCTZ 12.5 mg.

## 2016-05-14 NOTE — Telephone Encounter (Signed)
Patient scheduled for a follow up appointment on 05/15/2016

## 2016-05-15 ENCOUNTER — Ambulatory Visit (INDEPENDENT_AMBULATORY_CARE_PROVIDER_SITE_OTHER): Payer: BLUE CROSS/BLUE SHIELD | Admitting: Family Medicine

## 2016-05-15 ENCOUNTER — Encounter: Payer: Self-pay | Admitting: Family Medicine

## 2016-05-15 VITALS — BP 156/96 | HR 81 | Temp 98.2°F | Resp 16 | Wt 305.6 lb

## 2016-05-15 DIAGNOSIS — I10 Essential (primary) hypertension: Secondary | ICD-10-CM

## 2016-05-15 MED ORDER — LISINOPRIL 30 MG PO TABS: 30.0000 mg | ORAL_TABLET | Freq: Every day | ORAL | 3 refills | Status: DC

## 2016-05-15 MED ORDER — HYDROCHLOROTHIAZIDE 25 MG PO TABS: 25.0000 mg | ORAL_TABLET | Freq: Every day | ORAL | 3 refills | Status: DC

## 2016-05-15 NOTE — Progress Notes (Signed)
Patient: Travis Tanner Male    DOB: Aug 08, 1965   51 y.o.   MRN: [id number] Visit Date: 05/15/2016  Today's Provider: Dortha Kernennis Deboraha Goar, PA   Chief Complaint  Patient presents with  . Hypertension  . Follow-up   Subjective:    HPI  Hypertension, follow-up:  BP Readings from Last 3 Encounters:  05/15/16 (!) 156/96  07/15/15 140/90  04/08/15 (!) 142/84    He was last seen for hypertension 10 months ago.  BP at that visit was 140/90. Management changes since that visit include continue Lisinopril-HCTZ and added HCTZ 12.5 mg, and get labs done. He reports fair compliance with treatment. Patient did not get labs done that were ordered on 07/15/2015. He is not having side effects.  He is exercising minimal. He is adherent to low salt diet.   Outside blood pressures are being checked periodically. He is experiencing none.  Patient denies chest pain, chest pressure/discomfort, irregular heart beat and palpitations.   Cardiovascular risk factors include hypertension, male gender and obesity (BMI >= 30 kg/m2).  Use of agents associated with hypertension: none.     Weight trend: stable Wt Readings from Last 3 Encounters:  05/15/16 (!) 305 lb 9.6 oz (138.6 kg)  07/15/15 299 lb 12.8 oz (136 kg)  04/08/15 299 lb 3.2 oz (135.7 kg)    ------------------------------------------------------------------------ Patient Active Problem List   Diagnosis Date Noted  . Abdominal pain, epigastric 07/21/2014  . Essential (primary) hypertension 07/21/2014   Past Surgical History:  Procedure Laterality Date  . NO PAST SURGERIES     Family History  Problem Relation Age of Onset  . Cancer Mother   . Cancer Maternal Grandmother   . Cancer Maternal Grandfather   . Cancer Paternal Grandmother    No Known Allergies   Previous Medications   HYDROCHLOROTHIAZIDE (HYDRODIURIL) 12.5 MG TABLET    TAKE 1 TABLET (12.5 MG TOTAL) BY MOUTH DAILY.   LISINOPRIL-HYDROCHLOROTHIAZIDE (PRINZIDE,ZESTORETIC)  20-12.5 MG TABLET    Take 1 tablet by mouth daily.    Review of Systems  Constitutional: Negative.   Respiratory: Negative.   Cardiovascular: Negative.     Social History  Substance Use Topics  . Smoking status: Never Smoker  . Smokeless tobacco: Never Used  . Alcohol use 0.0 oz/week     Comment: occasionally- drinks twice a day and drinks 4-6 beers   Objective:   BP (!) 156/96 (BP Location: Right Arm, Patient Position: Sitting, Cuff Size: Normal)   Pulse 81   Temp 98.2 F (36.8 C) (Oral)   Resp 16   Wt (!) 305 lb 9.6 oz (138.6 kg)   SpO2 99%   BMI 43.23 kg/m   Physical Exam  Constitutional: He is oriented to person, place, and time. He appears well-developed and well-nourished. No distress.  HENT:  Head: Normocephalic and atraumatic.  Right Ear: Hearing normal.  Left Ear: Hearing normal.  Nose: Nose normal.  Eyes: Conjunctivae and lids are normal. Right eye exhibits no discharge. Left eye exhibits no discharge. No scleral icterus.  Neck: Neck supple.  Cardiovascular: Normal rate and regular rhythm.   Pulmonary/Chest: Effort normal and breath sounds normal. No respiratory distress.  Musculoskeletal: Normal range of motion.  Neurological: He is alert and oriented to person, place, and time.  Skin: Skin is intact. No lesion and no rash noted.  Psychiatric: He has a normal mood and affect. His speech is normal and behavior is normal. Thought content normal.      Assessment & Plan:  1. Essential (primary) hypertension Tolerating HCTZ 12.5 mg qd and Zestoretic 20-12.5 mg qd without palpitations, chest pains, dyspnea or muscle weakness/cramps. BP higher and weight up. Restrict sodium intake, exercise and lose weight. Will increase Lisinopril to 30 mg qd and combine HCTZ to one 25 mg tab qd. - hydrochlorothiazide (HYDRODIURIL) 25 MG tablet; Take 1 tablet (25 mg total) by mouth daily.  Dispense: 90 tablet; Refill: 3 - CBC with Differential/Platelet - Comprehensive  metabolic panel - Lipid panel - TSH - lisinopril (PRINIVIL,ZESTRIL) 30 MG tablet; Take 1 tablet (30 mg total) by mouth daily.  Dispense: 90 tablet; Refill: 3  2. Obesity, Class III, BMI 40-49.9 (morbid obesity) (HCC) Must work on weight loss with reduced calorie intake and more exercise. Recheck routine labs. - CBC with Differential/Platelet - Comprehensive metabolic panel - Lipid panel

## 2016-05-16 LAB — CBC WITH DIFFERENTIAL/PLATELET
BASOS ABS: 0 10*3/uL (ref 0.0–0.2)
BASOS: 0 %
EOS (ABSOLUTE): 0.1 10*3/uL (ref 0.0–0.4)
EOS: 2 %
HEMATOCRIT: 40.5 % (ref 37.5–51.0)
HEMOGLOBIN: 13 g/dL (ref 13.0–17.7)
IMMATURE GRANS (ABS): 0 10*3/uL (ref 0.0–0.1)
Immature Granulocytes: 0 %
LYMPHS ABS: 1.3 10*3/uL (ref 0.7–3.1)
Lymphs: 28 %
MCH: 26.7 pg (ref 26.6–33.0)
MCHC: 32.1 g/dL (ref 31.5–35.7)
MCV: 83 fL (ref 79–97)
MONOCYTES: 5 %
Monocytes Absolute: 0.2 10*3/uL (ref 0.1–0.9)
NEUTROS ABS: 3.1 10*3/uL (ref 1.4–7.0)
Neutrophils: 65 %
Platelets: 214 10*3/uL (ref 150–379)
RBC: 4.86 x10E6/uL (ref 4.14–5.80)
RDW: 13.9 % (ref 12.3–15.4)
WBC: 4.8 10*3/uL (ref 3.4–10.8)

## 2016-05-16 LAB — LIPID PANEL
CHOLESTEROL TOTAL: 186 mg/dL (ref 100–199)
Chol/HDL Ratio: 5.6 — ABNORMAL HIGH (ref 0.0–5.0)
HDL: 33 mg/dL — ABNORMAL LOW (ref >39)
LDL Calculated: 122 — ABNORMAL HIGH (ref 0–99)
Triglycerides: 157 mg/dL — ABNORMAL HIGH (ref 0–149)
VLDL CHOLESTEROL CAL: 31 (ref 5–40)

## 2016-05-16 LAB — COMPREHENSIVE METABOLIC PANEL
A/G RATIO: 1.2 (ref 1.2–2.2)
ALBUMIN: 4.1 g/dL (ref 3.5–5.5)
ALK PHOS: 55 IU/L (ref 39–117)
ALT: 27 IU/L (ref 0–44)
AST: 19 IU/L (ref 0–40)
BILIRUBIN TOTAL: 0.4 mg/dL (ref 0.0–1.2)
BUN / CREAT RATIO: 16 (ref 9–20)
BUN: 13 mg/dL (ref 6–24)
CHLORIDE: 97 mmol/L (ref 96–106)
CO2: 25 mmol/L (ref 18–29)
Calcium: 9.2 mg/dL (ref 8.7–10.2)
Creatinine, Ser: 0.81 mg/dL (ref 0.76–1.27)
GFR calc non Af Amer: 105 (ref >59)
GFR, EST AFRICAN AMERICAN: 121 (ref >59)
GLOBULIN, TOTAL: 3.5 (ref 1.5–4.5)
Glucose: 113 mg/dL — ABNORMAL HIGH (ref 65–99)
Potassium: 4.7 mmol/L (ref 3.5–5.2)
SODIUM: 139 mmol/L (ref 134–144)
TOTAL PROTEIN: 7.6 g/dL (ref 6.0–8.5)

## 2016-05-16 LAB — TSH: TSH: 2.34 u[IU]/mL (ref 0.450–4.500)

## 2016-06-18 ENCOUNTER — Encounter: Payer: Self-pay | Admitting: Family Medicine

## 2016-06-18 ENCOUNTER — Ambulatory Visit (INDEPENDENT_AMBULATORY_CARE_PROVIDER_SITE_OTHER): Payer: BLUE CROSS/BLUE SHIELD | Admitting: Family Medicine

## 2016-06-18 VITALS — BP 138/88 | HR 66 | Temp 98.7°F | Wt 301.6 lb

## 2016-06-18 DIAGNOSIS — E785 Hyperlipidemia, unspecified: Secondary | ICD-10-CM | POA: Insufficient documentation

## 2016-06-18 DIAGNOSIS — I10 Essential (primary) hypertension: Secondary | ICD-10-CM

## 2016-06-18 DIAGNOSIS — E782 Mixed hyperlipidemia: Secondary | ICD-10-CM

## 2016-06-18 NOTE — Progress Notes (Signed)
Patient: Travis Tanner Male    DOB: [birth year]   51 y.o.   MRN: [id number] Visit Date: 06/18/2016  Today's Provider: Dortha Kern, PA   Chief Complaint  Patient presents with  . Hypertension  . Follow-up   Subjective:    HPI  Hypertension, follow-up:  BP Readings from Last 3 Encounters:  06/18/16 138/88  05/15/16 (!) 156/96  07/15/15 140/90    He was last seen for hypertension 1 months ago.  BP at that visit was 156/96. Management changes since that visit include Will increase Lisinopril to 30 mg qd and combine HCTZ to one 25 mg tab qd. Restrict sodium intake, exercise and lose weight. He reports good compliance with treatment. He is not having side effects.  He is exercising. He is adherent to low salt diet.   Outside blood pressures are being checked periodically. He is experiencing none.  Patient denies chest pain, chest pressure/discomfort, irregular heart beat and palpitations.   Cardiovascular risk factors include obesity (BMI >= 30 kg/m2).  Use of agents associated with hypertension: none.     Weight trend: stable Wt Readings from Last 3 Encounters:  06/18/16 (!) 301 lb 9.6 oz (136.8 kg)  05/15/16 (!) 305 lb 9.6 oz (138.6 kg)  07/15/15 299 lb 12.8 oz (136 kg)    Current diet: in general, a "healthy" diet    ------------------------------------------------------------------------    Previous Medications   HYDROCHLOROTHIAZIDE (HYDRODIURIL) 25 MG TABLET    Take 1 tablet (25 mg total) by mouth daily.   LISINOPRIL (PRINIVIL,ZESTRIL) 30 MG TABLET    Take 1 tablet (30 mg total) by mouth daily.    Review of Systems  Constitutional: Negative.   Respiratory: Negative.   Cardiovascular: Negative.     Social History  Substance Use Topics  . Smoking status: Never Smoker  . Smokeless tobacco: Never Used  . Alcohol use 0.0 oz/week     Comment: occasionally- drinks twice a day and drinks 4-6 beers   Objective:   BP 138/88 (BP Location: Right Arm, Patient  Position: Sitting, Cuff Size: Normal)   Pulse 66   Temp 98.7 F (37.1 C) (Oral)   Wt (!) 301 lb 9.6 oz (136.8 kg)   SpO2 99%   BMI 42.66 kg/m   Physical Exam  Constitutional: He is oriented to person, place, and time. He appears well-developed and well-nourished. No distress.  HENT:  Head: Normocephalic and atraumatic.  Right Ear: Hearing normal.  Left Ear: Hearing normal.  Nose: Nose normal.  Eyes: Conjunctivae and lids are normal. Right eye exhibits no discharge. Left eye exhibits no discharge. No scleral icterus.  Neck: Neck supple.  Cardiovascular: Normal rate and regular rhythm.   Pulmonary/Chest: Effort normal and breath sounds normal. No respiratory distress.  Abdominal: Soft. Bowel sounds are normal.  Musculoskeletal: Normal range of motion.  Neurological: He is alert and oriented to person, place, and time.  Skin: Skin is intact. No lesion and no rash noted.  Psychiatric: He has a normal mood and affect. His speech is normal and behavior is normal. Thought content normal.      Assessment & Plan:     1. Essential (primary) hypertension Improved and stable. No chest pains, palpitations or muscle cramps. Tolerating Lisinopril 30 mg qd with HCTZ 25 mg qd. Recheck in 3 months.   2. Mixed hyperlipidemia LDL 122, Trig 157 and HDL 33 on 05-15-16. Has started exercise and low fat diet. Has lost 5 lbs over the past month. Continue this  regimen and recheck levels in 3 months.

## 2016-06-25 ENCOUNTER — Other Ambulatory Visit: Payer: Self-pay | Admitting: Family Medicine

## 2016-09-10 ENCOUNTER — Ambulatory Visit (INDEPENDENT_AMBULATORY_CARE_PROVIDER_SITE_OTHER): Payer: BLUE CROSS/BLUE SHIELD | Admitting: Family Medicine

## 2016-09-10 ENCOUNTER — Encounter: Payer: Self-pay | Admitting: Family Medicine

## 2016-09-10 VITALS — BP 142/82 | HR 93 | Temp 98.9°F | Wt 295.8 lb

## 2016-09-10 DIAGNOSIS — J0100 Acute maxillary sinusitis, unspecified: Secondary | ICD-10-CM | POA: Diagnosis not present

## 2016-09-10 DIAGNOSIS — I10 Essential (primary) hypertension: Secondary | ICD-10-CM | POA: Diagnosis not present

## 2016-09-10 DIAGNOSIS — E782 Mixed hyperlipidemia: Secondary | ICD-10-CM

## 2016-09-10 MED ORDER — AMOXICILLIN 875 MG PO TABS: 875.0000 mg | ORAL_TABLET | Freq: Two times a day (BID) | ORAL | 0 refills | Status: DC

## 2016-09-10 NOTE — Progress Notes (Signed)
Patient: Travis Tanner Male    DOB: 1965-11-27   51 y.o.   MRN: [id number] Visit Date: 09/10/2016  Today's Provider: Dortha Kernennis Aavya Shafer, PA   Chief Complaint  Patient presents with  . Hypertension  . Hyperlipidemia  . Follow-up  . Sinusitis   Subjective:    Sinusitis  This is a new problem. Episode onset: 1 week ago. The problem is unchanged. There has been no fever. Associated symptoms include congestion, coughing, headaches, sinus pressure and a sore throat (improved). Ear pain: ear pressure. Treatments tried: Tylenol Sinus. The treatment provided mild relief.    Hypertension, follow-up:  BP Readings from Last 3 Encounters:  09/10/16 (!) 142/82  06/18/16 138/88  05/15/16 (!) 156/96    He was last seen for hypertension 3 months ago.  BP at that visit was 138/88. Management since that visit includes continue medications, low fat diet and exercise.He reports good compliance with treatment. He is not having side effects.  He is exercising. He is adherent to low salt diet.   Outside blood pressures are being checked periodically. He is experiencing none.  Patient denies chest pain, chest pressure/discomfort, irregular heart beat and palpitations.   Cardiovascular risk factors include hypertension, male gender and obesity (BMI >= 30 kg/m2).  Use of agents associated with hypertension: none.   ------------------------------------------------------------------------    Lipid/Cholesterol, Follow-up:   Last seen for this 3 months ago.  Management since that visit includes continue low fat diet and exercise.  Last Lipid Panel:    Component Value Date/Time   CHOL 186 05/15/2016 0934   TRIG 157 (H) 05/15/2016 0934   HDL 33 (L) 05/15/2016 0934   CHOLHDL 5.6 (H) 05/15/2016 0934   LDLCALC 122 (H) 05/15/2016 0934    He reports good compliance with treatment. He is not having side effects.   Wt Readings from Last 3 Encounters:  09/10/16 295 lb 12.8 oz (134.2 kg)  06/18/16  (!) 301 lb 9.6 oz (136.8 kg)  05/15/16 (!) 305 lb 9.6 oz (138.6 kg)    ------------------------------------------------------------------------ Patient Active Problem List   Diagnosis Date Noted  . Hyperlipidemia 06/18/2016  . Abdominal pain, epigastric 07/21/2014  . Essential (primary) hypertension 07/21/2014   Past Surgical History:  Procedure Laterality Date  . NO PAST SURGERIES     Family History  Problem Relation Age of Onset  . Cancer Mother   . Cancer Maternal Grandmother   . Cancer Maternal Grandfather   . Cancer Paternal Grandmother    No Known Allergies   Previous Medications   HYDROCHLOROTHIAZIDE (HYDRODIURIL) 25 MG TABLET    Take 1 tablet (25 mg total) by mouth daily.   LISINOPRIL (PRINIVIL,ZESTRIL) 30 MG TABLET    Take 1 tablet (30 mg total) by mouth daily.    Review of Systems  Constitutional: Negative.   HENT: Positive for congestion, sinus pressure and sore throat (improved). Ear pain: ear pressure.   Respiratory: Positive for cough.   Cardiovascular: Negative.   Gastrointestinal: Negative.   Musculoskeletal: Negative.   Neurological: Positive for headaches.    Social History  Substance Use Topics  . Smoking status: Never Smoker  . Smokeless tobacco: Never Used  . Alcohol use 0.0 oz/week     Comment: occasionally- drinks twice a day and drinks 4-6 beers   Objective:   BP (!) 142/82 (BP Location: Right Arm, Patient Position: Sitting, Cuff Size: Large)   Pulse 93   Temp 98.9 F (37.2 C) (Oral)   Wt 295 lb 12.8 oz (  134.2 kg)   SpO2 99%   BMI 41.84 kg/m   Physical Exam  Constitutional: He appears well-developed and well-nourished.  HENT:  Head: Normocephalic.  Right Ear: External ear normal.  Left Ear: External ear normal.  Nose: Nose normal.  Mouth/Throat: Oropharynx is clear and moist.  Tender right maxillary sinus to palpation. No transillumination throughout all sinuses.  Eyes: Conjunctivae are normal.  Neck: Neck supple.    Cardiovascular: Normal rate and regular rhythm.   Pulmonary/Chest: Effort normal and breath sounds normal.  Lymphadenopathy:    He has no cervical adenopathy.  Neurological: He is alert.  Psychiatric: He has a normal mood and affect. His behavior is normal. Thought content normal.      Assessment & Plan:     1. Acute maxillary sinusitis, recurrence not specified Onset with sinus pressure, headache and teeth aching over the past week. No fever. Had some sore throat initially but cleared now. Recommend sinus irrigation, Mucinex, Fluticasone Nasal Spray and antibiotic. May use Tylenol or Advil prn pain. Increase water intake and recheck in 2 weeks if no better.  - amoxicillin (AMOXIL) 875 MG tablet; Take 1 tablet (875 mg total) by mouth 2 (two) times daily.  Dispense: 28 tablet; Refill: 0  2. Essential (primary) hypertension Fair control of BP today. Continues HCTZ 25 mg qd and Lisinopril 30 mg qd. Suspect some stress on BP due to sinusitis. Recheck after infection cleared.  3. Mixed hyperlipidemia Has lost 6 more pounds since last office visit. Postpone recheck of labs until sinusitis cleared. Continue low fat diet and work on more weight loss.

## 2016-09-10 NOTE — Patient Instructions (Signed)
Sinusitis, Adult Sinus Rinse What is a sinus rinse? A sinus rinse is a simple home treatment that is used to rinse your sinuses with a sterile mixture of salt and water (saline solution). Sinuses are air-filled spaces in your skull behind the bones of your face and forehead that open into your nasal cavity. You will use the following: Saline solution. Neti pot or spray bottle. This releases the saline solution into your nose and through your sinuses. Neti pots and spray bottles can be purchased at [employer], a health food store, or online.  When would I do a sinus rinse? A sinus rinse can help to clear mucus, dirt, dust, or pollen from the nasal cavity. You may do a sinus rinse when you have a cold, a virus, nasal allergy symptoms, a sinus infection, or stuffiness in the nose or sinuses. If you are considering a sinus rinse: Ask your child's health care provider before performing a sinus rinse on your child. Do not do a sinus rinse if you have had ear or nasal surgery, ear infection, or blocked ears.  How do I do a sinus rinse? Wash your hands. Disinfect your device according to the directions provided and then dry it. Use the solution that comes with your device or one that is sold separately in stores. Follow the mixing directions on the package. Fill your device with the amount of saline solution as directed by the device instructions. Stand over a sink and tilt your head sideways over the sink. Place the spout of the device in your upper nostril (the one closer to the ceiling). Gently pour or squeeze the saline solution into the nasal cavity. The liquid should drain to the lower nostril if you are not overly congested. Gently blow your nose. Blowing too hard may cause ear pain. Repeat in the other nostril. Clean and rinse your device with clean water and then air-dry it. Are there risks of a sinus rinse? Sinus rinse is generally very safe and effective. However, there are a  few risks, which include: A burning sensation in the sinuses. This may happen if you do not make the saline solution as directed. Make sure to follow all directions when making the saline solution. Infection from contaminated water. This is rare, but possible. Nasal irritation.  This information is not intended to replace advice given to you by your health care provider. Make sure you discuss any questions you have with your health care provider. Document Released: 10/07/2013 Document Revised: 02/07/2016 Document Reviewed: 07/28/2013 Elsevier Interactive Patient Education  2017 [employer] Inc. Sinusitis is soreness and inflammation of your sinuses. Sinuses are hollow spaces in the bones around your face. Your sinuses are located:  Around your eyes.  In the middle of your forehead.  Behind your nose.  In your cheekbones.  Your sinuses and nasal passages are lined with a stringy fluid (mucus). Mucus normally drains out of your sinuses. When your nasal tissues become inflamed or swollen, the mucus can become trapped or blocked so air cannot flow through your sinuses. This allows bacteria, viruses, and funguses to grow, which leads to infection. Sinusitis can develop quickly and last for 7?10 days (acute) or for more than 12 weeks (chronic). Sinusitis often develops after a cold. What are the causes? This condition is caused by anything that creates swelling in the sinuses or stops mucus from draining, including:  Allergies.  Asthma.  Bacterial or viral infection.  Abnormally shaped bones between the nasal passages.  Nasal  growths that contain mucus (nasal polyps).  Narrow sinus openings.  Pollutants, such as chemicals or irritants in the air.  A foreign object stuck in the nose.  A fungal infection. This is rare.  What increases the risk? The following factors may make you more likely to develop this condition:  Having allergies or asthma.  Having had a recent cold or  respiratory tract infection.  Having structural deformities or blockages in your nose or sinuses.  Having a weak immune system.  Doing a lot of swimming or diving.  Overusing nasal sprays.  Smoking.  What are the signs or symptoms? The main symptoms of this condition are pain and a feeling of pressure around the affected sinuses. Other symptoms include:  Upper toothache.  Earache.  Headache.  Bad breath.  Decreased sense of smell and taste.  A cough that may get worse at night.  Fatigue.  Fever.  Thick drainage from your nose. The drainage is often green and it may contain pus (purulent).  Stuffy nose or congestion.  Postnasal drip. This is when extra mucus collects in the throat or back of the nose.  Swelling and warmth over the affected sinuses.  Sore throat.  Sensitivity to light.  How is this diagnosed? This condition is diagnosed based on symptoms, a medical history, and a physical exam. To find out if your condition is acute or chronic, your health care provider may:  Look in your nose for signs of nasal polyps.  Tap over the affected sinus to check for signs of infection.  View the inside of your sinuses using an imaging device that has a light attached (endoscope).  If your health care provider suspects that you have chronic sinusitis, you may also:  Be tested for allergies.  Have a sample of mucus taken from your nose (nasal culture) and checked for bacteria.  Have a mucus sample examined to see if your sinusitis is related to an allergy.  If your sinusitis does not respond to treatment and it lasts longer than 8 weeks, you may have an MRI or CT scan to check your sinuses. These scans also help to determine how severe your infection is. In rare cases, a bone biopsy may be done to rule out more serious types of fungal sinus disease. How is this treated? Treatment for sinusitis depends on the cause and whether your condition is chronic or acute.  If a virus is causing your sinusitis, your symptoms will go away on their own within 10 days. You may be given medicines to relieve your symptoms, including:  Topical nasal decongestants. They shrink swollen nasal passages and let mucus drain from your sinuses.  Antihistamines. These drugs block inflammation that is triggered by allergies. This can help to ease swelling in your nose and sinuses.  Topical nasal corticosteroids. These are nasal sprays that ease inflammation and swelling in your nose and sinuses.  Nasal saline washes. These rinses can help to get rid of thick mucus in your nose.  If your condition is caused by bacteria, you will be given an antibiotic medicine. If your condition is caused by a fungus, you will be given an antifungal medicine. Surgery may be needed to correct underlying conditions, such as narrow nasal passages. Surgery may also be needed to remove polyps. Follow these instructions at home: Medicines  Take, use, or apply over-the-counter and prescription medicines only as told by your health care provider. These may include nasal sprays.  If you were prescribed an antibiotic  medicine, take it as told by your health care provider. Do not stop taking the antibiotic even if you start to feel better. Hydrate and Humidify  Drink enough water to keep your urine clear or pale yellow. Staying hydrated will help to thin your mucus.  Use a cool mist humidifier to keep the humidity level in your home above 50%.  Inhale steam for 10-15 minutes, 3-4 times a day or as told by your health care provider. You can do this in the bathroom while a hot shower is running.  Limit your exposure to cool or dry air. Rest  Rest as much as possible.  Sleep with your head raised (elevated).  Make sure to get enough sleep each night. General instructions  Apply a warm, moist washcloth to your face 3-4 times a day or as told by your health care provider. This will help with  discomfort.  Wash your hands often with soap and water to reduce your exposure to viruses and other germs. If soap and water are not available, use hand sanitizer.  Do not smoke. Avoid being around people who are smoking (secondhand smoke).  Keep all follow-up visits as told by your health care provider. This is important. Contact a health care provider if:  You have a fever.  Your symptoms get worse.  Your symptoms do not improve within 10 days. Get help right away if:  You have a severe headache.  You have persistent vomiting.  You have pain or swelling around your face or eyes.  You have vision problems.  You develop confusion.  Your neck is stiff.  You have trouble breathing. This information is not intended to replace advice given to you by your health care provider. Make sure you discuss any questions you have with your health care provider. Document Released: 03/12/2005 Document Revised: 11/06/2015 Document Reviewed: 01/05/2015 Elsevier Interactive Patient Education  2017 [employer]

## 2016-09-20 ENCOUNTER — Ambulatory Visit: Payer: BLUE CROSS/BLUE SHIELD | Admitting: Family Medicine

## 2017-01-15 ENCOUNTER — Ambulatory Visit (INDEPENDENT_AMBULATORY_CARE_PROVIDER_SITE_OTHER): Payer: BLUE CROSS/BLUE SHIELD | Admitting: Family Medicine

## 2017-01-15 ENCOUNTER — Encounter: Payer: Self-pay | Admitting: Family Medicine

## 2017-01-15 VITALS — BP 148/80 | HR 93 | Temp 97.9°F | Ht 70.0 in | Wt 289.8 lb

## 2017-01-15 DIAGNOSIS — K219 Gastro-esophageal reflux disease without esophagitis: Secondary | ICD-10-CM | POA: Diagnosis not present

## 2017-01-15 DIAGNOSIS — E782 Mixed hyperlipidemia: Secondary | ICD-10-CM | POA: Diagnosis not present

## 2017-01-15 DIAGNOSIS — I10 Essential (primary) hypertension: Secondary | ICD-10-CM | POA: Diagnosis not present

## 2017-01-15 NOTE — Progress Notes (Signed)
Patient: Travis Tanner Male    DOB: [birth year]   51 y.o.   MRN: [id number] Visit Date: 01/15/2017  Today's Provider: Dortha Kern, PA   Chief Complaint  Patient presents with  . Hypertension  . Hyperlipidemia  . Follow-up   Subjective:    HPI  Hypertension, follow-up: BP Readings from Last 3 Encounters:  01/15/17 (!) 148/80  09/10/16 (!) 142/82  06/18/16 138/88    He was last seen for hypertension 4 months ago.  BP at that visit was 142/82. Management since that visit includes continue medications, low fat diet and exercise. He reports good compliance with treatment. He is not having side effects.  He is lightly exercising. He is adherent to low salt diet.   Outside blood pressures are being checked and readings are fluctuating. He is experiencing none.  Patient denies chest pain, chest pressure/discomfort, irregular heart beat and palpitations.   Cardiovascular risk factors include hypertension, male gender and obesity (BMI >= 30 kg/m2).  Use of agents associated with hypertension: none.   ------------------------------------------------------------------------    Lipid/Cholesterol, Follow-up:   Last seen for this 4 months ago.  Management since that visit includes continue low fat diet and exercise.  Last Lipid Panel: Labs(Brief)   Lab Results  Component Value Date   CHOL 186 05/15/2016   HDL 33 (L) 05/15/2016   LDLCALC 122 (H) 05/15/2016   TRIG 157 (H) 05/15/2016   CHOLHDL 5.6 (H) 05/15/2016   He reports good compliance with treatment. He is not having side effects.      Wt Readings from Last 3 Encounters:  09/10/16 295 lb 12.8 oz (134.2 kg)  06/18/16 (!) 301 lb 9.6 oz (136.8 kg)  05/15/16 (!) 305 lb 9.6 oz (138.6 kg)   No past medical history on file. Patient Active Problem List   Diagnosis Date Noted  . Hyperlipidemia 06/18/2016  . Abdominal pain, epigastric 07/21/2014  . Essential (primary) hypertension 07/21/2014   Past  Surgical History:  Procedure Laterality Date  . NO PAST SURGERIES     Family History  Problem Relation Age of Onset  . Cancer Mother   . Cancer Maternal Grandmother   . Cancer Maternal Grandfather   . Cancer Paternal Grandmother    No Known Allergies   Previous Medications   HYDROCHLOROTHIAZIDE (HYDRODIURIL) 25 MG TABLET    Take 1 tablet (25 mg total) by mouth daily.   LISINOPRIL (PRINIVIL,ZESTRIL) 30 MG TABLET    Take 1 tablet (30 mg total) by mouth daily.    Review of Systems  Constitutional: Negative.   Respiratory: Negative.   Cardiovascular: Negative.     Social History  Substance Use Topics  . Smoking status: Never Smoker  . Smokeless tobacco: Never Used  . Alcohol use 0.0 oz/week     Comment: occasionally- drinks twice a day and drinks 4-6 beers   Objective:   BP (!) 148/80 (BP Location: Right Arm, Patient Position: Sitting, Cuff Size: Normal)   Pulse 93   Temp 97.9 F (36.6 C) (Oral)   Ht 5\' 10"  (1.778 m)   Wt 289 lb 12.8 oz (131.5 kg)   SpO2 96%   BMI 41.58 kg/m   Physical Exam  Constitutional: He is oriented to person, place, and time. He appears well-developed and well-nourished. No distress.  HENT:  Head: Normocephalic and atraumatic.  Right Ear: Hearing and external ear normal.  Left Ear: Hearing and external ear normal.  Nose: Nose normal.  Mouth/Throat: Oropharynx is clear and moist.  Eyes: Conjunctivae and lids are normal. Right eye exhibits no discharge. Left eye exhibits no discharge. No scleral icterus.  Cardiovascular: Normal rate and regular rhythm.   Pulmonary/Chest: Effort normal and breath sounds normal. No respiratory distress.  Abdominal: Soft. Bowel sounds are normal.  Musculoskeletal: Normal range of motion.  Neurological: He is alert and oriented to person, place, and time.  Skin: Skin is intact. No lesion and no rash noted.  Psychiatric: He has a normal mood and affect. His speech is normal and behavior is normal. Thought content  normal.      Assessment & Plan:     1. Essential (primary) hypertension Tolerating HCTZ and Lisinopril. No weakness or muscle cramps. Some slight cough but unsure if it is related to GERD. Will check labs and work on more weight loss. Treat GERD and recheck BP in a month pending lab reports. - CBC with Differential/Platelet - Comprehensive metabolic panel - Lipid panel - TSH  2. Mixed hyperlipidemia Has lost 6 more lbs since his last office visit in June 2018. Continue lower calorie low fat diet. May need statin pending lab reports. May add Enteric-Coated ASA 81 mg qd.  - Comprehensive metabolic panel - Lipid panel - TSH  3. Gastroesophageal reflux disease, esophagitis presence not specified Some dyspepsia and slight cough at night. Reminded him to limit caffeine, lose weight and reduce acidic/greasy/spicy foods in diet. May use OTC Nexium (worked well in the past) and recheck progress in a month. Check CBC. - CBC with Differential/Platelet

## 2017-01-18 DIAGNOSIS — E782 Mixed hyperlipidemia: Secondary | ICD-10-CM | POA: Diagnosis not present

## 2017-01-18 DIAGNOSIS — I10 Essential (primary) hypertension: Secondary | ICD-10-CM | POA: Diagnosis not present

## 2017-01-18 DIAGNOSIS — K219 Gastro-esophageal reflux disease without esophagitis: Secondary | ICD-10-CM | POA: Diagnosis not present

## 2017-01-19 LAB — CBC WITH DIFFERENTIAL/PLATELET
BASOS ABS: 0 10*3/uL (ref 0.0–0.2)
Basos: 0 %
EOS (ABSOLUTE): 0.1 10*3/uL (ref 0.0–0.4)
Eos: 2 %
HEMOGLOBIN: 14 g/dL (ref 13.0–17.7)
Hematocrit: 41.4 % (ref 37.5–51.0)
IMMATURE GRANS (ABS): 0 10*3/uL (ref 0.0–0.1)
IMMATURE GRANULOCYTES: 0 %
Lymphocytes Absolute: 1.4 10*3/uL (ref 0.7–3.1)
Lymphs: 31 %
MCH: 28 pg (ref 26.6–33.0)
MCHC: 33.8 g/dL (ref 31.5–35.7)
MCV: 83 fL (ref 79–97)
MONOCYTES: 7 %
Monocytes Absolute: 0.3 10*3/uL (ref 0.1–0.9)
NEUTROS PCT: 60 %
Neutrophils Absolute: 2.7 10*3/uL (ref 1.4–7.0)
PLATELETS: 229 10*3/uL (ref 150–379)
RBC: 5 x10E6/uL (ref 4.14–5.80)
RDW: 13.8 % (ref 12.3–15.4)
WBC: 4.6 10*3/uL (ref 3.4–10.8)

## 2017-01-19 LAB — COMPREHENSIVE METABOLIC PANEL
ALBUMIN: 4 g/dL (ref 3.5–5.5)
ALT: 25 IU/L (ref 0–44)
AST: 17 IU/L (ref 0–40)
Albumin/Globulin Ratio: 1.3 (ref 1.2–2.2)
Alkaline Phosphatase: 58 IU/L (ref 39–117)
BUN / CREAT RATIO: 15 (ref 9–20)
BUN: 15 mg/dL (ref 6–24)
Bilirubin Total: 0.4 mg/dL (ref 0.0–1.2)
CO2: 27 mmol/L (ref 20–29)
CREATININE: 0.97 mg/dL (ref 0.76–1.27)
Calcium: 9 mg/dL (ref 8.7–10.2)
Chloride: 100 mmol/L (ref 96–106)
GFR calc non Af Amer: 91 mL/min/{1.73_m2} (ref >59)
GFR, EST AFRICAN AMERICAN: 106 mL/min/{1.73_m2} (ref >59)
GLUCOSE: 100 mg/dL — AB (ref 65–99)
Globulin, Total: 3 g/dL (ref 1.5–4.5)
Potassium: 4.3 mmol/L (ref 3.5–5.2)
Sodium: 141 mmol/L (ref 134–144)
TOTAL PROTEIN: 7 g/dL (ref 6.0–8.5)

## 2017-01-19 LAB — TSH: TSH: 1.9 u[IU]/mL (ref 0.450–4.500)

## 2017-01-19 LAB — LIPID PANEL
CHOLESTEROL TOTAL: 155 mg/dL (ref 100–199)
Chol/HDL Ratio: 5.2 ratio — ABNORMAL HIGH (ref 0.0–5.0)
HDL: 30 mg/dL — AB (ref >39)
LDL CALC: 104 mg/dL — AB (ref 0–99)
Triglycerides: 105 mg/dL (ref 0–149)
VLDL CHOLESTEROL CAL: 21 mg/dL (ref 5–40)

## 2017-01-21 ENCOUNTER — Telehealth: Payer: Self-pay

## 2017-01-21 NOTE — Telephone Encounter (Signed)
Pt wanting to know if you will call and leave a message with the information you were speaking with him earlier about on the voicemail if you are not able to reach him as he is going into a meeting.    Pt states he tried to link up to my chart and he is not able to see the results in there.  I offered if we could send him a message through Mychart and he stated he would rather have a call with all the information left on the voicemail.

## 2017-01-21 NOTE — Telephone Encounter (Signed)
-----   Message from Tamsen Roersennis E Chrismon, [place] sent at 01/21/2017  8:33 AM EDT ----- All tests normal except HDL cholesterol is low. LDL close to goal. Continue efforts to lose weight. Adding Krill Oil qd with Co-Q 10 200 mg qd may help get HDL back up to normal. Recheck progress in 3-4 months.

## 2017-01-21 NOTE — Telephone Encounter (Signed)
Spoke with patient but his cell phone signal went out. He was requesting that lab results be emailed to him. Was trying to offer him my chart. Activation code sent to patient via text so he can view lab results.

## 2017-01-23 NOTE — Telephone Encounter (Signed)
Patient advised. Follow up appointment scheduled.  

## 2017-05-02 ENCOUNTER — Other Ambulatory Visit: Payer: Self-pay | Admitting: Family Medicine

## 2017-05-02 DIAGNOSIS — I10 Essential (primary) hypertension: Secondary | ICD-10-CM

## 2017-05-02 NOTE — Telephone Encounter (Signed)
[facility] pharmacy faxed a refill request for a 90-days supply for the following medications. Thanks CC  hydrochlorothiazide (HYDRODIURIL) 25 MG tablet   lisinopril (PRINIVIL,ZESTRIL) 30 MG tablet

## 2017-05-03 MED ORDER — LISINOPRIL 30 MG PO TABS: 30.0000 mg | ORAL_TABLET | Freq: Every day | ORAL | 3 refills | Status: DC

## 2017-05-03 MED ORDER — HYDROCHLOROTHIAZIDE 25 MG PO TABS: 25.0000 mg | ORAL_TABLET | Freq: Every day | ORAL | 3 refills | Status: DC

## 2017-05-04 ENCOUNTER — Telehealth: Payer: Self-pay | Admitting: Family Medicine

## 2017-05-04 NOTE — Telephone Encounter (Signed)
Opened in error Rx already sent to pharmacy. Thanks TNP

## 2017-05-23 ENCOUNTER — Ambulatory Visit: Payer: BLUE CROSS/BLUE SHIELD | Admitting: Family Medicine

## 2017-05-23 NOTE — Progress Notes (Deleted)
       Patient: Travis Tanner Male    DOB: 12/18/1965   52 y.o.   MRN: [id number] Visit Date: 05/23/2017  Today's Provider: Dortha Kernennis Chrismon, PA   No chief complaint on file.  Subjective:    HPI  Hypertension, follow-up: BP Readings from Last 3 Encounters:  01/15/17 (!) 148/80  09/10/16 (!) 142/82  06/18/16 138/88    Travis Tanner last seen for hypertension 4 monthsago.  BP at that visit was 148/80. Management since that visit includes continue medications, low fat diet and exercise. Treat GERD and recheck BP Hereports goodcompliance with treatment. Heis nothaving side effects.  He islightly exercising. Heisadherent to low salt diet.  Outside blood pressures are being checked and readings are fluctuating. Heis experiencing none.  Patient denies chest pain, chest pressure/discomfort, irregular heart beat and palpitations.  Cardiovascular risk factors include hypertension, male gender and obesity (BMI >= 30 kg/m2).  Use of agents associated with hypertension: none.   ------------------------------------------------------------------------    Lipid/Cholesterol, Follow-up:             Last seen for this 4 monthsago.  Management since that visit includes continue low fat diet and exercise.  Last Lipid Panel: Labs(Brief)   RecentLabs   Lab Results  Component Value Date   CHOL 155 01/18/2017   HDL 30 (L) 01/18/2017   LDLCALC 104 (H) 01/18/2017   TRIG 105 01/18/2017   CHOLHDL 5.2 (H) 01/18/2017    Hereports goodcompliance with treatment. Heis nothaving side effects.   Wt Readings from Last 3 Encounters:  01/15/17 289 lb 12.8 oz (131.5 kg)  09/10/16 295 lb 12.8 oz (134.2 kg)  06/18/16 (!) 301 lb 9.6 oz (136.8 kg)       No Known Allergies   Current Outpatient Medications:  .  hydrochlorothiazide (HYDRODIURIL) 25 MG tablet, Take 1 tablet (25 mg total) by mouth daily., Disp: 90 tablet, Rfl: 3 .  lisinopril (PRINIVIL,ZESTRIL) 30 MG tablet, Take  1 tablet (30 mg total) by mouth daily., Disp: 90 tablet, Rfl: 3  Review of Systems  Constitutional: Negative.   Respiratory: Negative.   Cardiovascular: Negative.     Social History   Tobacco Use  . Smoking status: Never Smoker  . Smokeless tobacco: Never Used  Substance Use Topics  . Alcohol use: Yes    Alcohol/week: 0.0 oz    Comment: occasionally- drinks twice a day and drinks 4-6 beers   Objective:   There were no vitals taken for this visit.   Physical Exam      Assessment & Plan:           Dortha Kernennis Chrismon, PA  [facility]

## 2018-04-30 ENCOUNTER — Other Ambulatory Visit: Payer: Self-pay | Admitting: Family Medicine

## 2018-04-30 DIAGNOSIS — I10 Essential (primary) hypertension: Secondary | ICD-10-CM

## 2018-06-02 ENCOUNTER — Ambulatory Visit: Payer: BLUE CROSS/BLUE SHIELD | Admitting: Family Medicine

## 2018-06-02 ENCOUNTER — Encounter: Payer: Self-pay | Admitting: Family Medicine

## 2018-06-02 VITALS — BP 150/70 | HR 92 | Temp 98.1°F | Resp 16 | Ht 70.0 in | Wt 292.0 lb

## 2018-06-02 DIAGNOSIS — Z125 Encounter for screening for malignant neoplasm of prostate: Secondary | ICD-10-CM | POA: Diagnosis not present

## 2018-06-02 DIAGNOSIS — E782 Mixed hyperlipidemia: Secondary | ICD-10-CM

## 2018-06-02 DIAGNOSIS — I10 Essential (primary) hypertension: Secondary | ICD-10-CM | POA: Diagnosis not present

## 2018-06-02 NOTE — Progress Notes (Signed)
Patient: Travis Tanner Male    DOB: October 07, 1965   53 y.o.   MRN: [id number] Visit Date: 06/02/2018  Today's Provider: Dortha Kern, PA   Chief Complaint  Patient presents with  . Follow-up  . Hypertension  . Gastroesophageal Reflux   Subjective:     HPI    Hypertension, follow-up:  BP Readings from Last 3 Encounters:  06/02/18 (!) 150/70  01/15/17 (!) 148/80  09/10/16 (!) 142/82    He was last seen for hypertension 01/15/2017.  BP at that visit was 148/80. Management since that visit includes; labs checked, no changes.He reports fair compliance with treatment. He is not having side effects. none He is not exercising. He is not adherent to low salt diet.   Outside blood pressures are 169/80. He is experiencing none.  Patient denies none.   Cardiovascular risk factors include obesity (BMI >= 30 kg/m2).  Use of agents associated with hypertension: none.   -------------------------------------------------------------------    Lipid/Cholesterol, Follow-up:   Last seen for this 01/15/2017.  Management since that visit includes; labs checked. Advised coated aspirin, diet, and exercise. Also recommended Krill oil with Co-Q 10.  Last Lipid Panel:    Component Value Date/Time   CHOL 155 01/18/2017 0000   TRIG 105 01/18/2017 0000   HDL 30 (L) 01/18/2017 0000   CHOLHDL 5.2 (H) 01/18/2017 0000   LDLCALC 104 (H) 01/18/2017 0000    He reports good compliance with treatment. He is not having side effects. none  Wt Readings from Last 3 Encounters:  06/02/18 292 lb (132.5 kg)  01/15/17 289 lb 12.8 oz (131.5 kg)  09/10/16 295 lb 12.8 oz (134.2 kg)    -------------------------------------------------------------------   Gastroesophageal reflux disease, esophagitis presence not specified From 01/15/2017-Reminded him to limit caffeine, lose weight and reduce acidic/greasy/spicy foods in diet. May use OTC Nexium (worked well in the past) and recheck progress in a  month.   History reviewed. No pertinent past medical history. Past Surgical History:  Procedure Laterality Date  . NO PAST SURGERIES     Patient Active Problem List   Diagnosis Date Noted  . Hyperlipidemia 06/18/2016  . Abdominal pain, epigastric 07/21/2014  . Essential (primary) hypertension 07/21/2014   Family History  Problem Relation Age of Onset  . Cancer Mother   . Cancer Maternal Grandmother   . Cancer Maternal Grandfather   . Cancer Paternal Grandmother    No Known Allergies  Current Outpatient Medications:  .  Coenzyme Q10 (CO Q 10 PO), Take by mouth., Disp: , Rfl:  .  hydrochlorothiazide (HYDRODIURIL) 25 MG tablet, TAKE 1 TABLET BY MOUTH EVERY DAY, Disp: 90 tablet, Rfl: 0 .  KRILL OIL PO, Take by mouth., Disp: , Rfl:  .  lisinopril (PRINIVIL,ZESTRIL) 30 MG tablet, TAKE 1 TABLET BY MOUTH EVERY DAY, Disp: 90 tablet, Rfl: 0  Review of Systems  Constitutional: Negative for appetite change, chills and fever.  Respiratory: Negative for chest tightness, shortness of breath and wheezing.   Cardiovascular: Negative for chest pain and palpitations.  Gastrointestinal: Negative for abdominal pain, nausea and vomiting.   Social History   Tobacco Use  . Smoking status: Never Smoker  . Smokeless tobacco: Never Used  Substance Use Topics  . Alcohol use: Yes    Alcohol/week: 0.0 standard drinks    Comment: occasionally- drinks twice a day and drinks 4-6 beers     Objective:   BP (!) 150/70 (BP Location: Right Arm, Patient Position: Sitting, Cuff  Size: Large)   Pulse 92   Temp 98.1 F (36.7 C) (Oral)   Resp 16   Ht 5\' 10"  (1.778 m)   Wt 292 lb (132.5 kg)   SpO2 99%   BMI 41.90 kg/m  Vitals:   06/02/18 1521  BP: (!) 150/70  Pulse: 92  Resp: 16  Temp: 98.1 F (36.7 C)  TempSrc: Oral  SpO2: 99%  Weight: 292 lb (132.5 kg)  Height: 5\' 10"  (1.778 m)   Physical Exam Constitutional:      General: He is not in acute distress.    Appearance: He is well-developed.    HENT:     Head: Normocephalic and atraumatic.     Right Ear: Hearing and tympanic membrane normal.     Left Ear: Hearing and tympanic membrane normal.     Nose: Nose normal.     Mouth/Throat:     Pharynx: Oropharynx is clear.  Eyes:     General: Lids are normal. No scleral icterus.       Right eye: No discharge.        Left eye: No discharge.     Conjunctiva/sclera: Conjunctivae normal.  Neck:     Musculoskeletal: Neck supple.     Vascular: No carotid bruit.  Cardiovascular:     Rate and Rhythm: Normal rate and regular rhythm.     Heart sounds: Normal heart sounds.  Pulmonary:     Effort: Pulmonary effort is normal. No respiratory distress.     Breath sounds: Normal breath sounds.  Abdominal:     General: Bowel sounds are normal.     Palpations: Abdomen is soft.  Musculoskeletal: Normal range of motion.  Lymphadenopathy:     Cervical: No cervical adenopathy.  Skin:    Findings: No lesion or rash.  Neurological:     Mental Status: He is alert and oriented to person, place, and time.  Psychiatric:        Speech: Speech normal.        Behavior: Behavior normal.        Thought Content: Thought content normal.       Assessment & Plan    1. Essential (primary) hypertension Fair BP control on the Lisinopril 30 mg qd with HCTZ 25 mg qd. Encouraged to restrict sodium intake and ETOH to maximum of 1-2 beers in any 24 hours. BMI over 41. Need to get routine labs and schedule CPE. - CBC with Differential/Platelet - Comprehensive metabolic panel - Lipid panel - TSH  2. Mixed hyperlipidemia Has tried [place] and Co-Q 10 qd each. Continue low fat diet and work on exercise with weight loss. Check CMP, Lipid Panel and TSH. May need statin pending report. - Comprehensive metabolic panel - Lipid panel - TSH  3. Screening PSA (prostate specific antigen) No significant frequency or nocturia. Denies decreased stream. Schedule for CPE with [facility] and arrange colonoscopy. - PSA      Dortha Kern, PA  [facility]

## 2018-06-03 ENCOUNTER — Other Ambulatory Visit: Payer: Self-pay | Admitting: Family Medicine

## 2018-06-03 DIAGNOSIS — E782 Mixed hyperlipidemia: Secondary | ICD-10-CM | POA: Diagnosis not present

## 2018-06-03 DIAGNOSIS — Z125 Encounter for screening for malignant neoplasm of prostate: Secondary | ICD-10-CM | POA: Diagnosis not present

## 2018-06-03 DIAGNOSIS — I10 Essential (primary) hypertension: Secondary | ICD-10-CM | POA: Diagnosis not present

## 2018-06-04 LAB — LIPID PANEL
Chol/HDL Ratio: 5.5 ratio — ABNORMAL HIGH (ref 0.0–5.0)
Cholesterol, Total: 169 mg/dL (ref 100–199)
HDL: 31 mg/dL — AB (ref >39)
LDL Calculated: 116 mg/dL — ABNORMAL HIGH (ref 0–99)
Triglycerides: 108 mg/dL (ref 0–149)
VLDL Cholesterol Cal: 22 mg/dL (ref 5–40)

## 2018-06-04 LAB — COMPREHENSIVE METABOLIC PANEL
A/G RATIO: 1.4 (ref 1.2–2.2)
ALT: 18 IU/L (ref 0–44)
AST: 18 IU/L (ref 0–40)
Albumin: 4.1 g/dL (ref 4.0–5.0)
Alkaline Phosphatase: 65 IU/L (ref 39–117)
BUN/Creatinine Ratio: 14 (ref 9–20)
BUN: 15 mg/dL (ref 6–24)
Bilirubin Total: 0.6 mg/dL (ref 0.0–1.2)
CO2: 25 mmol/L (ref 20–29)
Calcium: 9.1 mg/dL (ref 8.7–10.2)
Chloride: 100 mmol/L (ref 96–106)
Creatinine, Ser: 1.05 mg/dL (ref 0.76–1.27)
GFR calc Af Amer: 95 mL/min/{1.73_m2} (ref >59)
GFR calc non Af Amer: 82 mL/min/{1.73_m2} (ref >59)
Globulin, Total: 2.9 g/dL (ref 1.5–4.5)
Glucose: 93 mg/dL (ref 65–99)
POTASSIUM: 4 mmol/L (ref 3.5–5.2)
Sodium: 141 mmol/L (ref 134–144)
Total Protein: 7 g/dL (ref 6.0–8.5)

## 2018-06-04 LAB — CBC WITH DIFFERENTIAL/PLATELET
BASOS: 1 %
Basophils Absolute: 0 10*3/uL (ref 0.0–0.2)
EOS (ABSOLUTE): 0.1 10*3/uL (ref 0.0–0.4)
Eos: 2 %
Hematocrit: 40.6 % (ref 37.5–51.0)
Hemoglobin: 13.5 g/dL (ref 13.0–17.7)
Immature Grans (Abs): 0 10*3/uL (ref 0.0–0.1)
Immature Granulocytes: 0 %
Lymphocytes Absolute: 1.3 10*3/uL (ref 0.7–3.1)
Lymphs: 27 %
MCH: 27.9 pg (ref 26.6–33.0)
MCHC: 33.3 g/dL (ref 31.5–35.7)
MCV: 84 fL (ref 79–97)
Monocytes Absolute: 0.3 10*3/uL (ref 0.1–0.9)
Monocytes: 7 %
NEUTROS PCT: 63 %
Neutrophils Absolute: 2.9 10*3/uL (ref 1.4–7.0)
Platelets: 222 10*3/uL (ref 150–450)
RBC: 4.84 x10E6/uL (ref 4.14–5.80)
RDW: 12.7 % (ref 11.6–15.4)
WBC: 4.6 10*3/uL (ref 3.4–10.8)

## 2018-06-04 LAB — PSA: Prostate Specific Ag, Serum: 1.3 ng/mL (ref 0.0–4.0)

## 2018-06-04 LAB — TSH: TSH: 1.73 u[IU]/mL (ref 0.450–4.500)

## 2018-06-05 ENCOUNTER — Telehealth: Payer: Self-pay | Admitting: *Deleted

## 2018-06-05 NOTE — Telephone Encounter (Signed)
-----   Message from Jodell Cipro [place], [place] sent at 06/05/2018 12:33 PM EDT ----- All blood tests are normal except HDL low and LDL going back up. Recommend Atorvastatin 20 mg qd #90 and work on weight loss. Recheck progress in 3 months.

## 2018-06-05 NOTE — Telephone Encounter (Signed)
LMOVM for pt to return call 

## 2018-06-05 NOTE — Telephone Encounter (Signed)
Patient is returning your call. KW 

## 2018-06-06 MED ORDER — ATORVASTATIN CALCIUM 20 MG PO TABS: 20.0000 mg | ORAL_TABLET | Freq: Every day | ORAL | 0 refills | Status: DC

## 2018-06-06 NOTE — Telephone Encounter (Signed)
Patient was notified of results. Expressed understanding. Rx sent to pharmacy. Follow up ov scheduled for 09/08/2018 @8 :00 am.

## 2018-08-24 ENCOUNTER — Other Ambulatory Visit: Payer: Self-pay | Admitting: Family Medicine

## 2018-08-24 DIAGNOSIS — I10 Essential (primary) hypertension: Secondary | ICD-10-CM

## 2018-09-08 ENCOUNTER — Encounter: Payer: Self-pay | Admitting: Family Medicine

## 2018-09-08 ENCOUNTER — Ambulatory Visit: Payer: BC Managed Care – PPO | Admitting: Family Medicine

## 2018-09-08 ENCOUNTER — Other Ambulatory Visit: Payer: Self-pay

## 2018-09-08 VITALS — BP 152/84 | HR 74 | Temp 98.3°F | Wt 293.0 lb

## 2018-09-08 DIAGNOSIS — E782 Mixed hyperlipidemia: Secondary | ICD-10-CM | POA: Diagnosis not present

## 2018-09-08 DIAGNOSIS — I10 Essential (primary) hypertension: Secondary | ICD-10-CM | POA: Diagnosis not present

## 2018-09-08 NOTE — Progress Notes (Signed)
Travis Tanner  MRN: [id number] DOB: [birth year]  Subjective:  HPI   The patient is a 53 year old male who presents for follow up of his lipids.  He was last seen in March and had lab work done at that time.  His last lipids are listed below.  He was started on Atorvastatin at that time and returns today to recheck levels.  He reports good compliance and the only adverse effect he has is that if he hasn't eaten in a while before taking the medicine he feels as though it makes him need to use the bathroom.  It is of note that he is taking it in the morning not in the evening.  Patient Active Problem List   Diagnosis Date Noted  . Hyperlipidemia 06/18/2016  . Abdominal pain, epigastric 07/21/2014  . Essential (primary) hypertension 07/21/2014   No past medical history on file.  Past Surgical History:  Procedure Laterality Date  . NO PAST SURGERIES     Family History  Problem Relation Age of Onset  . Cancer Mother   . Cancer Maternal Grandmother   . Cancer Maternal Grandfather   . Cancer Paternal Grandmother    Social History   Socioeconomic History  . Marital status: Married    Spouse name: Not on file  . Number of children: Not on file  . Years of education: Not on file  . Highest education level: Not on file  Occupational History  . Not on file  Social Needs  . Financial resource strain: Not on file  . Food insecurity    Worry: Not on file    Inability: Not on file  . Transportation needs    Medical: Not on file    Non-medical: Not on file  Tobacco Use  . Smoking status: Never Smoker  . Smokeless tobacco: Never Used  Substance and Sexual Activity  . Alcohol use: Yes    Alcohol/week: 0.0 standard drinks    Comment: occasionally- drinks twice a day and drinks 4-6 beers  . Drug use: No  . Sexual activity: Not on file  Lifestyle  . Physical activity    Days per week: Not on file    Minutes per session: Not on file  . Stress: Not on file  Relationships  . Social  Musicianconnections    Talks on phone: Not on file    Gets together: Not on file    Attends religious service: Not on file    Active member of club or organization: Not on file    Attends meetings of clubs or organizations: Not on file    Relationship status: Not on file  . Intimate partner violence    Fear of current or ex partner: Not on file    Emotionally abused: Not on file    Physically abused: Not on file    Forced sexual activity: Not on file  Other Topics Concern  . Not on file  Social History Narrative  . Not on file    Outpatient Encounter Medications as of 09/08/2018  Medication Sig  . atorvastatin (LIPITOR) 20 MG tablet TAKE 1 TABLET BY MOUTH EVERY DAY  . Coenzyme Q10 (CO Q 10 PO) Take by mouth.  . hydrochlorothiazide (HYDRODIURIL) 25 MG tablet TAKE 1 TABLET BY MOUTH EVERY DAY  . KRILL OIL PO Take by mouth.  Marland Kitchen. lisinopril (ZESTRIL) 30 MG tablet TAKE 1 TABLET BY MOUTH EVERY DAY   No facility-administered encounter medications on file as of 09/08/2018.  No Known Allergies  Review of Systems  Constitutional: Negative for fever.  Respiratory: Negative for cough, shortness of breath and wheezing.   Cardiovascular: Negative for chest pain, palpitations, claudication and leg swelling.  Gastrointestinal: Negative for abdominal pain, diarrhea, nausea and vomiting.    Objective:  BP (!) 152/84 (BP Location: Right Arm, Patient Position: Sitting, Cuff Size: Normal)   Pulse 74   Temp 98.3 F (36.8 C) (Oral)   Wt 293 lb (132.9 kg)   SpO2 99%   BMI 42.04 kg/m   Wt Readings from Last 3 Encounters:  09/08/18 293 lb (132.9 kg)  06/02/18 292 lb (132.5 kg)  01/15/17 289 lb 12.8 oz (131.5 kg)   Physical Exam  Constitutional: He is oriented to person, place, and time and well-developed, well-nourished, and in no distress.  HENT:  Head: Normocephalic.  Eyes: Conjunctivae are normal.  Neck: Neck supple.  Cardiovascular: Normal rate and regular rhythm.  Pulmonary/Chest: Effort  normal.  Abdominal: Soft. Bowel sounds are normal.  Musculoskeletal: Normal range of motion.  Neurological: He is alert and oriented to person, place, and time.  Skin: No rash noted.  Psychiatric: Mood, affect and judgment normal.    Assessment and Plan :   1. Mixed hyperlipidemia Weight fairly stable but BMI still over 42. Tolerating Lipitor 20 mg qd with Co-Q 10 and Krill Oil qd. Recommend regular exercise and continue present medications. Recheck CMP and Lipid Panel to assess progress. Follow up pending reports. - Comprehensive metabolic panel - Lipid Panel With LDL/HDL Ratio  2. Essential (primary) hypertension Tolerating Lisinopril 30 mg qd and HCTZ 25 mg qd without angioedema, muscle cramps, cough or palpitations. Will recheck CMP and continue present dosage. Should work on better control of salt intake and follow up pending reports. - Comprehensive metabolic panel

## 2018-09-09 LAB — COMPREHENSIVE METABOLIC PANEL
ALT: 25 IU/L (ref 0–44)
AST: 21 IU/L (ref 0–40)
Albumin/Globulin Ratio: 1.3 (ref 1.2–2.2)
Albumin: 4 g/dL (ref 4.0–5.0)
Alkaline Phosphatase: 67 IU/L (ref 39–117)
BUN/Creatinine Ratio: 15 (ref 9–20)
BUN: 15 mg/dL (ref 6–24)
Bilirubin Total: 0.4 mg/dL (ref 0.0–1.2)
CO2: 25 mmol/L (ref 20–29)
Calcium: 9.1 mg/dL (ref 8.7–10.2)
Chloride: 98 mmol/L (ref 96–106)
Creatinine, Ser: 1 mg/dL (ref 0.76–1.27)
GFR calc Af Amer: 101 mL/min/{1.73_m2} (ref >59)
GFR calc non Af Amer: 87 mL/min/{1.73_m2} (ref >59)
Globulin, Total: 3 g/dL (ref 1.5–4.5)
Glucose: 102 mg/dL — ABNORMAL HIGH (ref 65–99)
Potassium: 3.9 mmol/L (ref 3.5–5.2)
Sodium: 139 mmol/L (ref 134–144)
Total Protein: 7 g/dL (ref 6.0–8.5)

## 2018-09-09 LAB — LIPID PANEL WITH LDL/HDL RATIO
Cholesterol, Total: 116 mg/dL (ref 100–199)
HDL: 34 mg/dL — ABNORMAL LOW
LDL Calculated: 67 mg/dL (ref 0–99)
LDl/HDL Ratio: 2 ratio (ref 0.0–3.6)
Triglycerides: 76 mg/dL (ref 0–149)
VLDL Cholesterol Cal: 15 mg/dL (ref 5–40)

## 2018-10-16 ENCOUNTER — Other Ambulatory Visit: Payer: Self-pay

## 2018-10-16 ENCOUNTER — Encounter: Payer: Self-pay | Admitting: Physician Assistant

## 2018-10-16 ENCOUNTER — Ambulatory Visit: Payer: BC Managed Care – PPO | Admitting: Physician Assistant

## 2018-10-16 VITALS — BP 146/81 | HR 91 | Temp 98.1°F | Wt 293.0 lb

## 2018-10-16 DIAGNOSIS — H6982 Other specified disorders of Eustachian tube, left ear: Secondary | ICD-10-CM | POA: Diagnosis not present

## 2018-10-16 MED ORDER — FLUTICASONE PROPIONATE 50 MCG/ACT NA SUSP: 2.0000 | Freq: Every day | NASAL | 0 refills | Status: DC

## 2018-10-16 NOTE — Progress Notes (Signed)
Patient: Travis Tanner Male    DOB: Jun 13, 1965   53 y.o.   MRN: [id number] Visit Date: 10/16/2018  Today's Provider: Mar Daring, PA-C   Chief Complaint  Patient presents with  . Ear Pain    Left ear started about three weeks ago comes and goes.    Subjective:     Otalgia  There is pain in the left ear. This is a new problem. Episode onset: Started about three weeks ago. The problem has been gradually worsening (In the last week.). There has been no fever. Associated symptoms include hearing loss and neck pain (Left side). Pertinent negatives include no abdominal pain, coughing, diarrhea, ear discharge, headaches, rash, rhinorrhea, sore throat or vomiting. He has tried ear drops for the symptoms. The treatment provided mild relief.   Patient states it is not really ear pain, but a pressure he feels. When the pressure is at its highest he will also experience some hearing loss and tinnitus. Both are intermittent. Denies fevers or dizziness. Went to the beach approx 2-3 weeks ago and felt he got ocean water in his ear. Tried to OTC swimmer's ear drops, which improved symptoms slightly, but they have not completely resolved.   No Known Allergies   Current Outpatient Medications:  .  atorvastatin (LIPITOR) 20 MG tablet, TAKE 1 TABLET BY MOUTH EVERY DAY, Disp: 90 tablet, Rfl: 3 .  Coenzyme Q10 (CO Q 10 PO), Take by mouth., Disp: , Rfl:  .  hydrochlorothiazide (HYDRODIURIL) 25 MG tablet, TAKE 1 TABLET BY MOUTH EVERY DAY, Disp: 90 tablet, Rfl: 3 .  KRILL OIL PO, Take by mouth., Disp: , Rfl:  .  lisinopril (ZESTRIL) 30 MG tablet, TAKE 1 TABLET BY MOUTH EVERY DAY, Disp: 90 tablet, Rfl: 3  Review of Systems  Constitutional: Negative.  Negative for fever.  HENT: Positive for ear pain, hearing loss and tinnitus. Negative for congestion, ear discharge, postnasal drip, rhinorrhea, sinus pressure, sinus pain, sneezing, sore throat, trouble swallowing and voice change.   Respiratory:  Negative.  Negative for cough.   Gastrointestinal: Negative.  Negative for abdominal pain, diarrhea and vomiting.  Musculoskeletal: Positive for neck pain (Left side) and neck stiffness.  Skin: Negative for rash.  Neurological: Negative for dizziness, light-headedness and headaches.    Social History   Tobacco Use  . Smoking status: Never Smoker  . Smokeless tobacco: Never Used  Substance Use Topics  . Alcohol use: Yes    Alcohol/week: 0.0 standard drinks    Comment: occasionally- drinks twice a day and drinks 4-6 beers      Objective:   Wt 293 lb (132.9 kg)   BMI 42.04 kg/m  Vitals:   10/16/18 0818  Weight: 293 lb (132.9 kg)     Physical Exam Vitals signs reviewed.  Constitutional:      General: He is not in acute distress.    Appearance: Normal appearance. He is well-developed. He is not ill-appearing or diaphoretic.  HENT:     Head: Normocephalic and atraumatic.     Right Ear: Hearing, tympanic membrane, ear canal and external ear normal. No middle ear effusion. Tympanic membrane is not erythematous or bulging.     Left Ear: Hearing, ear canal and external ear normal. A middle ear effusion (clear with air bubbles) is present. Tympanic membrane is not erythematous or bulging.     Nose: Nose normal. No mucosal edema, congestion or rhinorrhea.     Right Sinus: No maxillary sinus  tenderness or frontal sinus tenderness.     Left Sinus: No maxillary sinus tenderness or frontal sinus tenderness.     Mouth/Throat:     Mouth: Mucous membranes are moist.     Pharynx: Oropharynx is clear. Uvula midline. No oropharyngeal exudate or posterior oropharyngeal erythema.  Eyes:     General:        Right eye: No discharge.        Left eye: No discharge.     Conjunctiva/sclera: Conjunctivae normal.     Pupils: Pupils are equal, round, and reactive to light.  Neck:     Musculoskeletal: Normal range of motion and neck supple.     Thyroid: No thyromegaly.     Trachea: No tracheal  deviation.     Meningeal: Brudzinski's sign and Kernig's sign absent.  Cardiovascular:     Rate and Rhythm: Normal rate and regular rhythm.     Heart sounds: Normal heart sounds. No murmur. No friction rub. No gallop.   Pulmonary:     Effort: Pulmonary effort is normal. No respiratory distress.     Breath sounds: Normal breath sounds. No stridor. No wheezing or rales.  Lymphadenopathy:     Cervical: No cervical adenopathy.  Skin:    General: Skin is warm and dry.  Neurological:     Mental Status: He is alert.     No results found for any visits on 10/16/18.     Assessment & Plan    1. Dysfunction of left eustachian tube Suspect ETD of the left. Will try flonase as below for 1-2 weeks. May also use Sudafed 10mg  OTC (BID no more than 3 days due tot HTN). Discussed equalizing exercises to try. Call if not improving or if hearing loss/tinnitus remain and may refer to ENT.  - fluticasone (FLONASE) 50 MCG/ACT nasal spray; Place 2 sprays into both nostrils daily.  Dispense: 16 g; Refill: 0     Margaretann LovelessJennifer M Garnette Greb, PA-C  [facility]

## 2018-10-16 NOTE — Patient Instructions (Signed)

## 2018-11-07 ENCOUNTER — Other Ambulatory Visit: Payer: Self-pay | Admitting: Physician Assistant

## 2018-11-07 DIAGNOSIS — H6982 Other specified disorders of Eustachian tube, left ear: Secondary | ICD-10-CM

## 2018-11-22 ENCOUNTER — Other Ambulatory Visit: Payer: Self-pay | Admitting: Physician Assistant

## 2018-11-22 DIAGNOSIS — H6982 Other specified disorders of Eustachian tube, left ear: Secondary | ICD-10-CM

## 2019-02-03 ENCOUNTER — Ambulatory Visit: Payer: BC Managed Care – PPO | Admitting: Family Medicine

## 2019-02-03 ENCOUNTER — Encounter: Payer: Self-pay | Admitting: Family Medicine

## 2019-02-03 ENCOUNTER — Other Ambulatory Visit: Payer: Self-pay

## 2019-02-03 VITALS — BP 138/70 | HR 78 | Temp 97.5°F | Wt 289.0 lb

## 2019-02-03 DIAGNOSIS — H6982 Other specified disorders of Eustachian tube, left ear: Secondary | ICD-10-CM | POA: Diagnosis not present

## 2019-02-03 DIAGNOSIS — M25512 Pain in left shoulder: Secondary | ICD-10-CM | POA: Diagnosis not present

## 2019-02-03 DIAGNOSIS — H9312 Tinnitus, left ear: Secondary | ICD-10-CM

## 2019-02-03 NOTE — Progress Notes (Signed)
Philippe D Villagran Jr.  MRN: [id number] DOB: Nov 24, 1965  Subjective:  HPI   The patient is a 53 year old male who presents for evaluation of recurrent ear pain.  The patient was seen and treated for this by Grace Bushy, PA-C on 10/16/18.  She diagnosed him with eustachian tube dysfunction.  The patient states it got better but it keeps coming back and he needs to know why. He states that he has ringing in the ear. The pain goes into and down the left side of his neck  Patient Active Problem List   Diagnosis Date Noted  . Hyperlipidemia 06/18/2016  . Abdominal pain, epigastric 07/21/2014  . Essential (primary) hypertension 07/21/2014   Past Surgical History:  Procedure Laterality Date  . NO PAST SURGERIES     No past medical history on file.  Social History   Socioeconomic History  . Marital status: Married    Spouse name: Not on file  . Number of children: Not on file  . Years of education: Not on file  . Highest education level: Not on file  Occupational History  . Not on file  Social Needs  . Financial resource strain: Not on file  . Food insecurity    Worry: Not on file    Inability: Not on file  . Transportation needs    Medical: Not on file    Non-medical: Not on file  Tobacco Use  . Smoking status: Never Smoker  . Smokeless tobacco: Never Used  Substance and Sexual Activity  . Alcohol use: Yes    Alcohol/week: 0.0 standard drinks    Comment: occasionally- drinks twice a day and drinks 4-6 beers  . Drug use: No  . Sexual activity: Not on file  Lifestyle  . Physical activity    Days per week: Not on file    Minutes per session: Not on file  . Stress: Not on file  Relationships  . Social Herbalist on phone: Not on file    Gets together: Not on file    Attends religious service: Not on file    Active member of club or organization: Not on file    Attends meetings of clubs or organizations: Not on file    Relationship status: Not on file  .  Intimate partner violence    Fear of current or ex partner: Not on file    Emotionally abused: Not on file    Physically abused: Not on file    Forced sexual activity: Not on file  Other Topics Concern  . Not on file  Social History Narrative  . Not on file   Outpatient Encounter Medications as of 02/03/2019  Medication Sig  . atorvastatin (LIPITOR) 20 MG tablet TAKE 1 TABLET BY MOUTH EVERY DAY  . Coenzyme Q10 (CO Q 10 PO) Take by mouth.  . hydrochlorothiazide (HYDRODIURIL) 25 MG tablet TAKE 1 TABLET BY MOUTH EVERY DAY  . KRILL OIL PO Take by mouth.  Marland Kitchen lisinopril (ZESTRIL) 30 MG tablet TAKE 1 TABLET BY MOUTH EVERY DAY  . fluticasone (FLONASE) 50 MCG/ACT nasal spray SPRAY 2 SPRAYS INTO EACH NOSTRIL EVERY DAY   No facility-administered encounter medications on file as of 02/03/2019.    No Known Allergies  Review of Systems  Constitutional: Negative for chills, diaphoresis, fever and malaise/fatigue.  HENT: Positive for ear pain. Negative for congestion, sinus pain and sore throat.   Respiratory: Negative for cough and shortness of breath.   Cardiovascular:  Negative for chest pain.  Gastrointestinal: Negative for abdominal pain and diarrhea.  Musculoskeletal: Negative for myalgias.  Neurological: Negative for headaches.    Objective:  BP 138/70 (BP Location: Right Arm, Patient Position: Sitting, Cuff Size: Large)   Pulse 78   Temp (!) 97.5 F (36.4 C) (Skin)   Wt 289 lb (131.1 kg)   SpO2 99%   BMI 41.47 kg/m   Physical Exam  Constitutional: He is oriented to person, place, and time and well-developed, well-nourished, and in no distress.  HENT:  Head: Normocephalic.  Right Ear: External ear normal.  Left Ear: External ear normal.  Mouth/Throat: Oropharynx is clear and moist.  Left TM retracted without erythema, pain to palpate tragus or drainage.   Eyes: Conjunctivae are normal.  Neck: Neck supple.  Cardiovascular: Normal rate and regular rhythm.  Pulmonary/Chest:  Effort normal and breath sounds normal.  Abdominal: Soft. Bowel sounds are normal.  Musculoskeletal: Normal range of motion.     Comments: Slight tenderness in the left anterior shoulder joint to palpation or stretch rotator cuff with abduction to 90 degrees and arm rotated externally.  Neurological: He is alert and oriented to person, place, and time.  Skin: No rash noted.  Psychiatric: Mood, affect and judgment normal.    Assessment and Plan :   1. Dysfunction of left eustachian tube Recurrence and worsening of stopped up/swollen sensation of the left ear over the past 3-4 weeks. Similar episode in July 2020 that responded to low dose Sudafed and Flonase for a short time. Notices a noise in the left ear and discomfort responding to weather changes. May continue Flonase and try Mucinex-D. If no better in a couple weeks with regular use of medications, may need referral to an ENT to consider a ventilation tube.  2. Tinnitus of left ear Roaring and ringing in the left ear with diminished hearing. Suspect secondary to eustachian dysfunction.   3. Left shoulder pain, unspecified chronicity Has felt a pop in the left shoulder after installing some fence posts a couple weeks ago. Continues to have some discomfort with certain movements. Suspect strain of rotator cuff and may use topical Icy/Hot or NSAID (OTC). Apply moist heat and recheck if no better in 2 weeks. May need x-ray evaluation and/or orthopedic referral.

## 2019-06-05 ENCOUNTER — Other Ambulatory Visit: Payer: Self-pay

## 2019-06-05 ENCOUNTER — Encounter: Payer: Self-pay | Admitting: Family Medicine

## 2019-06-05 ENCOUNTER — Ambulatory Visit (INDEPENDENT_AMBULATORY_CARE_PROVIDER_SITE_OTHER): Payer: BC Managed Care – PPO | Admitting: Family Medicine

## 2019-06-05 VITALS — BP 148/84 | HR 83 | Temp 96.2°F | Resp 18 | Wt 294.6 lb

## 2019-06-05 DIAGNOSIS — Z8042 Family history of malignant neoplasm of prostate: Secondary | ICD-10-CM | POA: Diagnosis not present

## 2019-06-05 DIAGNOSIS — I10 Essential (primary) hypertension: Secondary | ICD-10-CM | POA: Diagnosis not present

## 2019-06-05 DIAGNOSIS — Z1211 Encounter for screening for malignant neoplasm of colon: Secondary | ICD-10-CM

## 2019-06-05 DIAGNOSIS — Z6841 Body Mass Index (BMI) 40.0 and over, adult: Secondary | ICD-10-CM

## 2019-06-05 DIAGNOSIS — E782 Mixed hyperlipidemia: Secondary | ICD-10-CM | POA: Diagnosis not present

## 2019-06-05 DIAGNOSIS — Z0000 Encounter for general adult medical examination without abnormal findings: Secondary | ICD-10-CM

## 2019-06-05 NOTE — Patient Instructions (Signed)
Fat and Cholesterol Restricted Eating Plan Getting too much fat and cholesterol in your diet may cause health problems. Choosing the right foods helps keep your fat and cholesterol at normal levels. This can keep you from getting certain diseases. Your doctor may recommend an eating plan that includes:  Total fat: ______% or less of total calories a day.  Saturated fat: ______% or less of total calories a day.  Cholesterol: less than _________mg a day.  Fiber: ______g a day. What are tips for following this plan? Meal planning  At meals, divide your plate into four equal parts: ? Fill one-half of your plate with vegetables and green salads. ? Fill one-fourth of your plate with whole grains. ? Fill one-fourth of your plate with low-fat (lean) protein foods.  Eat fish that is high in omega-3 fats at least two times a week. This includes mackerel, tuna, sardines, and salmon.  Eat foods that are high in fiber, such as whole grains, beans, apples, broccoli, carrots, peas, and barley. General tips   Work with your doctor to lose weight if you need to.  Avoid: ? Foods with added sugar. ? Fried foods. ? Foods with partially hydrogenated oils.  Limit alcohol intake to no more than 1 drink a day for nonpregnant women and 2 drinks a day for men. One drink equals 12 oz of beer, 5 oz of wine, or 1 oz of hard liquor. Reading food labels  Check food labels for: ? Trans fats. ? Partially hydrogenated oils. ? Saturated fat (g) in each serving. ? Cholesterol (mg) in each serving. ? Fiber (g) in each serving.  Choose foods with healthy fats, such as: ? Monounsaturated fats. ? Polyunsaturated fats. ? Omega-3 fats.  Choose grain products that have whole grains. Look for the word "whole" as the first word in the ingredient list. Cooking  Cook foods using low-fat methods. These include baking, boiling, grilling, and broiling.  Eat more home-cooked foods. Eat at restaurants and buffets  less often.  Avoid cooking using saturated fats, such as butter, cream, palm oil, palm kernel oil, and coconut oil. Recommended foods  Fruits  All fresh, canned (in natural juice), or frozen fruits. Vegetables  Fresh or frozen vegetables (raw, steamed, roasted, or grilled). Green salads. Grains  Whole grains, such as whole wheat or whole grain breads, crackers, cereals, and pasta. Unsweetened oatmeal, bulgur, barley, quinoa, or brown rice. Corn or whole wheat flour tortillas. Meats and other protein foods  Ground beef (85% or leaner), grass-fed beef, or beef trimmed of fat. Skinless chicken or turkey. Ground chicken or turkey. Pork trimmed of fat. All fish and seafood. Egg whites. Dried beans, peas, or lentils. Unsalted nuts or seeds. Unsalted canned beans. Nut butters without added sugar or oil. Dairy  Low-fat or nonfat dairy products, such as skim or 1% milk, 2% or reduced-fat cheeses, low-fat and fat-free ricotta or cottage cheese, or plain low-fat and nonfat yogurt. Fats and oils  Tub margarine without trans fats. Light or reduced-fat mayonnaise and salad dressings. Avocado. Olive, canola, sesame, or safflower oils. The items listed above may not be a complete list of foods and beverages you can eat. Contact a dietitian for more information. Foods to avoid Fruits  Canned fruit in heavy syrup. Fruit in cream or butter sauce. Fried fruit. Vegetables  Vegetables cooked in cheese, cream, or butter sauce. Fried vegetables. Grains  White bread. White pasta. White rice. Cornbread. Bagels, pastries, and croissants. Crackers and snack foods that contain trans fat   and hydrogenated oils. Meats and other protein foods  Fatty cuts of meat. Ribs, chicken wings, bacon, sausage, bologna, salami, chitterlings, fatback, hot dogs, bratwurst, and packaged lunch meats. Liver and organ meats. Whole eggs and egg yolks. Chicken and turkey with skin. Fried meat. Dairy  Whole or 2% milk, cream,  half-and-half, and cream cheese. Whole milk cheeses. Whole-fat or sweetened yogurt. Full-fat cheeses. Nondairy creamers and whipped toppings. Processed cheese, cheese spreads, and cheese curds. Beverages  Alcohol. Sugar-sweetened drinks such as sodas, lemonade, and fruit drinks. Fats and oils  Butter, stick margarine, lard, shortening, ghee, or bacon fat. Coconut, palm kernel, and palm oils. Sweets and desserts  Corn syrup, sugars, honey, and molasses. Candy. Jam and jelly. Syrup. Sweetened cereals. Cookies, pies, cakes, donuts, muffins, and ice cream. The items listed above may not be a complete list of foods and beverages you should avoid. Contact a dietitian for more information. Summary  Choosing the right foods helps keep your fat and cholesterol at normal levels. This can keep you from getting certain diseases.  At meals, fill one-half of your plate with vegetables and green salads.  Eat high-fiber foods, like whole grains, beans, apples, carrots, peas, and barley.  Limit added sugar, saturated fats, alcohol, and fried foods. This information is not intended to replace advice given to you by your health care provider. Make sure you discuss any questions you have with your health care provider. Document Revised: 11/13/2017 Document Reviewed: 11/27/2016 Elsevier Patient Education  2020 Elsevier Inc.  

## 2019-06-05 NOTE — Progress Notes (Signed)
Patient: Travis Pine., Male    DOB: 02-18-1966, 54 y.o.   MRN: [id number] Visit Date: 06/05/2019  Today's Provider: Dortha Kern, PA    Subjective:     Annual physical exam Travis D Haylen Shelnutt. is a 54 y.o. male who presents today for health maintenance and complete physical. He feels fairly well. He reports exercising walking at work. He reports he is sleeping fairly well.  -----------------------------------------------------------------   Review of Systems  Constitutional: Negative.   HENT: Negative.  Negative for congestion, dental problem, drooling, ear discharge, ear pain, facial swelling, hearing loss, mouth sores, nosebleeds, postnasal drip, rhinorrhea, sinus pressure, sinus pain, sneezing, sore throat, tinnitus and trouble swallowing.   Eyes: Negative.   Respiratory: Negative.   Cardiovascular: Negative.   Gastrointestinal: Negative.   Endocrine: Negative.   Genitourinary: Negative.   Musculoskeletal: Negative.   Skin: Negative.   Allergic/Immunologic: Negative.   Neurological: Negative.   Hematological: Negative.   Psychiatric/Behavioral: Negative.     Social History      He  reports that he has never smoked. He has never used smokeless tobacco. He reports current alcohol use. He reports that he does not use drugs.       Social History   Socioeconomic History  . Marital status: Married    Spouse name: Not on file  . Number of children: Not on file  . Years of education: Not on file  . Highest education level: Not on file  Occupational History  . Not on file  Tobacco Use  . Smoking status: Never Smoker  . Smokeless tobacco: Never Used  Substance and Sexual Activity  . Alcohol use: Yes    Alcohol/week: 0.0 standard drinks    Comment: occasionally- drinks twice a day and drinks 4-6 beers  . Drug use: No  . Sexual activity: Not on file  Other Topics Concern  . Not on file  Social History Narrative  . Not on file   Social Determinants of  Health   Financial Resource Strain:   . Difficulty of Paying Living Expenses:   Food Insecurity:   . Worried About Programme researcher, broadcasting/film/video in the Last Year:   . Barista in the Last Year:   Transportation Needs:   . Freight forwarder (Medical):   Marland Kitchen Lack of Transportation (Non-Medical):   Physical Activity:   . Days of Exercise per Week:   . Minutes of Exercise per Session:   Stress:   . Feeling of Stress :   Social Connections:   . Frequency of Communication with Friends and Family:   . Frequency of Social Gatherings with Friends and Family:   . Attends Religious Services:   . Active Member of Clubs or Organizations:   . Attends Banker Meetings:   Marland Kitchen Marital Status:    No past medical history on file.  Patient Active Problem List   Diagnosis Date Noted  . Hyperlipidemia 06/18/2016  . Abdominal pain, epigastric 07/21/2014  . Essential (primary) hypertension 07/21/2014    Past Surgical History:  Procedure Laterality Date  . NO PAST SURGERIES      Family History        Family Status  Relation Name Status  . Mother  Deceased  . Father  Alive  . MGM  Deceased  . MGF  Deceased  . PGM  Deceased  . PGF  Deceased        His family history  includes Cancer in his maternal grandfather, maternal grandmother, mother, and paternal grandmother.      No Known Allergies   Current Outpatient Medications:  .  atorvastatin (LIPITOR) 20 MG tablet, TAKE 1 TABLET BY MOUTH EVERY DAY, Disp: 90 tablet, Rfl: 3 .  Coenzyme Q10 (CO Q 10 PO), Take by mouth., Disp: , Rfl:  .  fluticasone (FLONASE) 50 MCG/ACT nasal spray, SPRAY 2 SPRAYS INTO EACH NOSTRIL EVERY DAY (Patient not taking: Reported on 02/03/2019), Disp: 48 mL, Rfl: 1 .  hydrochlorothiazide (HYDRODIURIL) 25 MG tablet, TAKE 1 TABLET BY MOUTH EVERY DAY, Disp: 90 tablet, Rfl: 3 .  KRILL OIL PO, Take by mouth., Disp: , Rfl:  .  lisinopril (ZESTRIL) 30 MG tablet, TAKE 1 TABLET BY MOUTH EVERY DAY, Disp: 90 tablet,  Rfl: 3   Patient Care Team: Margo Common, PA as PCP - General (Physician Assistant)    Objective:    Vitals: BP (!) 148/84 (BP Location: Right Arm, Cuff Size: Large)   Pulse 83   Temp (!) 96.2 F (35.7 C) (Temporal)   Resp 18   Wt 294 lb 9.6 oz (133.6 kg)   SpO2 100%   BMI 42.27 kg/m    Wt Readings from Last 3 Encounters:  06/05/19 294 lb 9.6 oz (133.6 kg)  02/03/19 289 lb (131.1 kg)  10/16/18 293 lb (132.9 kg)   BP Readings from Last 3 Encounters:  06/05/19 (!) 148/84  02/03/19 138/70  10/16/18 (!) 146/81   Vitals:   06/05/19 0932 06/05/19 0952  BP: (!) 155/93 (!) 148/84  Pulse: 83   Resp: 18   Temp: (!) 96.2 F (35.7 C)   TempSrc: Temporal   SpO2: 100%   Weight: 294 lb 9.6 oz (133.6 kg)     Physical Exam Constitutional:      Appearance: He is well-developed.  HENT:     Head: Normocephalic and atraumatic.     Right Ear: External ear normal.     Left Ear: External ear normal.     Nose: Nose normal.  Eyes:     General:        Right eye: No discharge.     Conjunctiva/sclera: Conjunctivae normal.     Pupils: Pupils are equal, round, and reactive to light.  Neck:     Thyroid: No thyromegaly.     Trachea: No tracheal deviation.  Cardiovascular:     Rate and Rhythm: Normal rate and regular rhythm.     Heart sounds: Normal heart sounds. No murmur.  Pulmonary:     Effort: Pulmonary effort is normal. No respiratory distress.     Breath sounds: Normal breath sounds. No wheezing or rales.  Chest:     Chest wall: No tenderness.  Abdominal:     General: There is no distension.     Palpations: Abdomen is soft. There is no mass.     Tenderness: There is no abdominal tenderness. There is no guarding or rebound.  Genitourinary:    Penis: Normal.      Testes: Normal.     Prostate: Normal.     Rectum: Normal. Guaiac result negative.  Musculoskeletal:        General: No tenderness. Normal range of motion.     Cervical back: Normal range of motion and neck  supple.     Comments: Large varicose veins right posterior thigh and many superficial varicosities both legs.  Lymphadenopathy:     Cervical: No cervical adenopathy.  Skin:    General:  Skin is warm and dry.     Findings: No erythema or rash.  Neurological:     Mental Status: He is alert and oriented to person, place, and time.     Cranial Nerves: No cranial nerve deficit.     Motor: No abnormal muscle tone.     Coordination: Coordination normal.     Deep Tendon Reflexes: Reflexes are normal and symmetric. Reflexes normal.  Psychiatric:        Behavior: Behavior normal.        Thought Content: Thought content normal.        Judgment: Judgment normal.    Depression Screen PHQ 2/9 Scores 01/15/2017  PHQ - 2 Score 0      Assessment & Plan:     Routine Health Maintenance and Physical Exam  Exercise Activities and Dietary recommendations Goals   Recommend walking exercise 30-40 minutes 3-4 days a week. Low fat diet with increase in water intake.     Immunization History  Administered Date(s) Administered  . Tdap 09/23/2008    Health Maintenance  Topic Date Due  . HIV Screening  Never done  . COLONOSCOPY  Never done  . TETANUS/TDAP  09/24/2018  . INFLUENZA VACCINE  Never done     Discussed health benefits of physical activity, and encouraged him to engage in regular exercise appropriate for his age and condition.    --------------------------------------------------------------------  1. Annual physical exam General health stable. Weight no better. Encouraged to get COVID vaccination then get Tetanus booster and consider shingles vaccination in 60-90 days. Ready to consider colonoscopy this year. Given anticipatory counseling and get routine labs. Should use compression stockings for varicose veins of both legs. - CBC with Differential/Platelet - Comprehensive metabolic panel - Lipid panel - PSA - TSH  2. Essential (primary) hypertension Borderline BP today.  Still taking Lisinopril 30 mg qd with HCTZ 25 mg qd. Restrict salt intake, caffeine, exercise regularly and must lose weight. Check follow up labs and recheck pending reports. - CBC with Differential/Platelet - Comprehensive metabolic panel - Lipid panel - TSH  3. Mixed hyperlipidemia Tolerating the Atorvastatin 20 mg qd, Krill oil qd and Co-Q 10 qd. Has gained 5 lbs since November 2020. May need increase in Atorvastatin. Must lose weight, exercise regularly and get follow up labs. - Comprehensive metabolic panel - Lipid panel - TSH  4. Family history of prostate cancer - PSA  5. Screening for colon cancer - Ambulatory referral to Gastroenterology  6. Body mass index (BMI) of 40.1-44.9 in adult [facility]) Counseled regarding health risk with obesity. Must restart exercise program and reduce caloric intake. Checking routine labs and follow up pending reports.  Dortha Kern, PA  [facility]

## 2019-06-06 LAB — TSH: TSH: 1.72 u[IU]/mL (ref 0.450–4.500)

## 2019-06-06 LAB — CBC WITH DIFFERENTIAL/PLATELET
Basophils Absolute: 0 10*3/uL (ref 0.0–0.2)
Basos: 0 %
EOS (ABSOLUTE): 0.1 10*3/uL (ref 0.0–0.4)
Eos: 1 %
Hematocrit: 42.9 % (ref 37.5–51.0)
Hemoglobin: 13.9 g/dL (ref 13.0–17.7)
Immature Grans (Abs): 0 10*3/uL (ref 0.0–0.1)
Immature Granulocytes: 0 %
Lymphocytes Absolute: 1.1 10*3/uL (ref 0.7–3.1)
Lymphs: 18 %
MCH: 27.1 pg (ref 26.6–33.0)
MCHC: 32.4 g/dL (ref 31.5–35.7)
MCV: 84 fL (ref 79–97)
Monocytes Absolute: 0.4 10*3/uL (ref 0.1–0.9)
Monocytes: 6 %
Neutrophils Absolute: 4.4 10*3/uL (ref 1.4–7.0)
Neutrophils: 75 %
Platelets: 211 10*3/uL (ref 150–450)
RBC: 5.13 x10E6/uL (ref 4.14–5.80)
RDW: 13.1 % (ref 11.6–15.4)
WBC: 5.9 10*3/uL (ref 3.4–10.8)

## 2019-06-06 LAB — COMPREHENSIVE METABOLIC PANEL
ALT: 20 IU/L (ref 0–44)
AST: 17 IU/L (ref 0–40)
Albumin/Globulin Ratio: 1.3 (ref 1.2–2.2)
Albumin: 4.1 g/dL (ref 3.8–4.9)
Alkaline Phosphatase: 73 IU/L (ref 39–117)
BUN/Creatinine Ratio: 13 (ref 9–20)
BUN: 13 mg/dL (ref 6–24)
Bilirubin Total: 0.5 mg/dL (ref 0.0–1.2)
CO2: 26 mmol/L (ref 20–29)
Calcium: 9.2 mg/dL (ref 8.7–10.2)
Chloride: 99 mmol/L (ref 96–106)
Creatinine, Ser: 0.97 mg/dL (ref 0.76–1.27)
GFR calc Af Amer: 104 mL/min/{1.73_m2} (ref >59)
GFR calc non Af Amer: 90 mL/min/{1.73_m2} (ref >59)
Globulin, Total: 3.1 g/dL (ref 1.5–4.5)
Glucose: 94 mg/dL (ref 65–99)
Potassium: 3.9 mmol/L (ref 3.5–5.2)
Sodium: 138 mmol/L (ref 134–144)
Total Protein: 7.2 g/dL (ref 6.0–8.5)

## 2019-06-06 LAB — PSA: Prostate Specific Ag, Serum: 0.5 ng/mL (ref 0.0–4.0)

## 2019-06-06 LAB — LIPID PANEL
Chol/HDL Ratio: 4.1 ratio (ref 0.0–5.0)
Cholesterol, Total: 139 mg/dL (ref 100–199)
HDL: 34 mg/dL — ABNORMAL LOW (ref >39)
LDL Chol Calc (NIH): 87 mg/dL (ref 0–99)
Triglycerides: 92 mg/dL (ref 0–149)
VLDL Cholesterol Cal: 18 mg/dL (ref 5–40)

## 2019-06-09 ENCOUNTER — Telehealth: Payer: Self-pay

## 2019-06-09 NOTE — Telephone Encounter (Signed)
-----   Message from Tamsen Roers, [place] sent at 06/08/2019  8:20 AM EDT ----- All blood tests normal except HDL low. Continue Atorvastatin, Co-Q 10 and Krill Oil daily. Work on exercise and weight loss should help this come back in line. Recheck progress in 6 months.

## 2019-06-09 NOTE — Telephone Encounter (Signed)
Mailbox is full and cannot leave message at this time.

## 2019-08-15 ENCOUNTER — Other Ambulatory Visit: Payer: Self-pay | Admitting: Family Medicine

## 2019-08-15 DIAGNOSIS — I10 Essential (primary) hypertension: Secondary | ICD-10-CM

## 2019-11-26 ENCOUNTER — Other Ambulatory Visit: Payer: Self-pay

## 2019-11-26 ENCOUNTER — Telehealth: Payer: Self-pay

## 2019-11-26 DIAGNOSIS — U071 COVID-19: Secondary | ICD-10-CM | POA: Diagnosis not present

## 2019-11-26 NOTE — Telephone Encounter (Signed)
Patient advised as below.  

## 2019-11-26 NOTE — Telephone Encounter (Signed)
OK to have him come by for parking lot COVID test today.  Make sure to order it ahead of time [id number]

## 2019-11-26 NOTE — Telephone Encounter (Signed)
Copied from CRM [id number]. Topic: Appointment Scheduling - Scheduling Inquiry for Clinic >> Nov 26, 2019  9:08 AM Wyonia Hough E wrote: Reason for CRM: Pt needs a covid test done /Pt has no symptoms / his employer is requiring it and he needs results by 9.8.21/ Pt asked if he can be schedule today or tomorrow/ please advise

## 2019-11-28 LAB — NOVEL CORONAVIRUS, NAA: SARS-CoV-2, NAA: NOT DETECTED

## 2019-12-03 ENCOUNTER — Telehealth: Payer: Self-pay

## 2019-12-03 DIAGNOSIS — U071 COVID-19: Secondary | ICD-10-CM

## 2019-12-03 NOTE — Telephone Encounter (Signed)
Ok to order. [id number]

## 2019-12-03 NOTE — Telephone Encounter (Signed)
Patient just tested on 11/26/19 and results were negative, is it to soon to retest? If not can you place order in chart for testing. KW

## 2019-12-03 NOTE — Telephone Encounter (Signed)
Copied from CRM [id number] [id number]. Topic: Appointment Scheduling - Scheduling Inquiry for Clinic >> Dec 03, 2019  9:43 AM Marylen Ponto wrote: Reason for CRM: Pt stated he would like to come by the office for Covid testing. Pt stated he has to be tested weekly for his job and would like to schedule an appt. Pt requests call back

## 2019-12-05 LAB — NOVEL CORONAVIRUS, NAA: SARS-CoV-2, NAA: NOT DETECTED

## 2019-12-05 LAB — SARS-COV-2, NAA 2 DAY TAT

## 2019-12-07 ENCOUNTER — Telehealth: Payer: Self-pay

## 2019-12-07 NOTE — Telephone Encounter (Signed)
Pt advised.   Thanks,   -Zeriyah Wain  

## 2019-12-07 NOTE — Telephone Encounter (Signed)
-----   Message from Erasmo Downer, MD sent at 12/07/2019  8:22 AM EDT ----- Negative COVID test.

## 2019-12-10 ENCOUNTER — Other Ambulatory Visit: Payer: Self-pay

## 2019-12-10 DIAGNOSIS — Z1159 Encounter for screening for other viral diseases: Secondary | ICD-10-CM

## 2019-12-12 LAB — NOVEL CORONAVIRUS, NAA: SARS-CoV-2, NAA: NOT DETECTED

## 2019-12-12 LAB — SARS-COV-2, NAA 2 DAY TAT

## 2019-12-16 ENCOUNTER — Other Ambulatory Visit: Payer: Self-pay

## 2019-12-16 DIAGNOSIS — Z1152 Encounter for screening for COVID-19: Secondary | ICD-10-CM

## 2019-12-17 DIAGNOSIS — Z1152 Encounter for screening for COVID-19: Secondary | ICD-10-CM | POA: Diagnosis not present

## 2019-12-20 LAB — SPECIMEN STATUS REPORT

## 2019-12-20 LAB — NOVEL CORONAVIRUS, NAA: SARS-CoV-2, NAA: NOT DETECTED

## 2019-12-20 LAB — SARS-COV-2, NAA 2 DAY TAT

## 2019-12-22 ENCOUNTER — Telehealth: Payer: Self-pay

## 2019-12-22 NOTE — Telephone Encounter (Signed)
Patient advised as directed below. 

## 2019-12-22 NOTE — Telephone Encounter (Signed)
-----   Message from Tamsen Roers, [place] sent at 12/21/2019  8:51 AM EDT ----- COVID tests are negative. Schedule for virtual visit if symptoms persisting.

## 2019-12-23 ENCOUNTER — Other Ambulatory Visit: Payer: Self-pay

## 2019-12-23 DIAGNOSIS — Z1152 Encounter for screening for COVID-19: Secondary | ICD-10-CM

## 2019-12-24 DIAGNOSIS — Z1152 Encounter for screening for COVID-19: Secondary | ICD-10-CM | POA: Diagnosis not present

## 2019-12-26 LAB — NOVEL CORONAVIRUS, NAA: SARS-CoV-2, NAA: NOT DETECTED

## 2019-12-26 LAB — SARS-COV-2, NAA 2 DAY TAT

## 2019-12-30 ENCOUNTER — Telehealth: Payer: Self-pay

## 2019-12-30 ENCOUNTER — Other Ambulatory Visit: Payer: Self-pay

## 2019-12-30 DIAGNOSIS — Z1152 Encounter for screening for COVID-19: Secondary | ICD-10-CM

## 2019-12-30 NOTE — Telephone Encounter (Signed)
Copied from CRM [id number]. Topic: General - Other >> Dec 30, 2019  1:21 PM Tamela Oddi wrote: Reason for CRM: Patient would like the nurse to call him regarding getting his covid test.  He stated that he has been getting weekly testing at the office for the past 5 weeks.   Please advise and call patient to discuss at [phone]

## 2019-12-31 DIAGNOSIS — Z1152 Encounter for screening for COVID-19: Secondary | ICD-10-CM | POA: Diagnosis not present

## 2020-01-01 LAB — SARS-COV-2, NAA 2 DAY TAT

## 2020-01-01 LAB — NOVEL CORONAVIRUS, NAA: SARS-CoV-2, NAA: NOT DETECTED

## 2020-01-06 ENCOUNTER — Other Ambulatory Visit: Payer: Self-pay

## 2020-01-06 DIAGNOSIS — Z1152 Encounter for screening for COVID-19: Secondary | ICD-10-CM

## 2020-01-07 DIAGNOSIS — Z1152 Encounter for screening for COVID-19: Secondary | ICD-10-CM | POA: Diagnosis not present

## 2020-01-08 LAB — NOVEL CORONAVIRUS, NAA: SARS-CoV-2, NAA: NOT DETECTED

## 2020-01-08 LAB — SARS-COV-2, NAA 2 DAY TAT

## 2020-01-13 ENCOUNTER — Telehealth: Payer: Self-pay

## 2020-01-13 NOTE — Telephone Encounter (Signed)
Copied from CRM [id number]. Topic: General - Other >> Jan 13, 2020  4:12 PM Gwenlyn Fudge wrote: Reason for CRM: Pt calling and is requesting to be put on the schedule for a covid test. He states that he is not experiencing symptoms. Please advise.

## 2020-01-14 ENCOUNTER — Other Ambulatory Visit: Payer: Self-pay

## 2020-01-14 DIAGNOSIS — Z1152 Encounter for screening for COVID-19: Secondary | ICD-10-CM

## 2020-01-15 LAB — NOVEL CORONAVIRUS, NAA: SARS-CoV-2, NAA: NOT DETECTED

## 2020-01-15 LAB — SARS-COV-2, NAA 2 DAY TAT

## 2020-01-20 ENCOUNTER — Other Ambulatory Visit: Payer: Self-pay

## 2020-01-20 DIAGNOSIS — Z1152 Encounter for screening for COVID-19: Secondary | ICD-10-CM

## 2020-01-21 DIAGNOSIS — Z1152 Encounter for screening for COVID-19: Secondary | ICD-10-CM | POA: Diagnosis not present

## 2020-01-22 LAB — NOVEL CORONAVIRUS, NAA: SARS-CoV-2, NAA: NOT DETECTED

## 2020-01-22 LAB — SARS-COV-2, NAA 2 DAY TAT

## 2020-01-27 ENCOUNTER — Telehealth: Payer: Self-pay

## 2020-01-27 DIAGNOSIS — Z1152 Encounter for screening for COVID-19: Secondary | ICD-10-CM

## 2020-01-27 NOTE — Telephone Encounter (Signed)
Copied from CRM [id number]. Topic: Quick Communication - See Telephone Encounter >> Jan 27, 2020  1:59 PM Aretta Nip wrote: CRM for notification. See Telephone encounter for: 01/27/20.Pt need CB to sch covid testing, called office exception pt pls FU for this Thurs appt at [phone]

## 2020-01-28 DIAGNOSIS — Z1152 Encounter for screening for COVID-19: Secondary | ICD-10-CM | POA: Diagnosis not present

## 2020-01-28 NOTE — Telephone Encounter (Signed)
Has a standing order in his chart ready for release.

## 2020-01-29 LAB — NOVEL CORONAVIRUS, NAA: SARS-CoV-2, NAA: NOT DETECTED

## 2020-01-29 LAB — SARS-COV-2, NAA 2 DAY TAT

## 2020-02-03 ENCOUNTER — Telehealth: Payer: Self-pay

## 2020-02-03 NOTE — Telephone Encounter (Signed)
See standing order to be released.

## 2020-02-03 NOTE — Telephone Encounter (Signed)
Copied from CRM [id number]. Topic: Appointment Scheduling - Scheduling Inquiry for Clinic >> Feb 03, 2020  1:09 PM Randol Kern wrote: Best contact [phone] pt wants to be covid tested, please advise

## 2020-02-04 ENCOUNTER — Other Ambulatory Visit: Payer: Self-pay

## 2020-02-04 DIAGNOSIS — Z1152 Encounter for screening for COVID-19: Secondary | ICD-10-CM

## 2020-02-04 NOTE — Telephone Encounter (Signed)
Done and patient advised.  

## 2020-02-05 ENCOUNTER — Telehealth: Payer: Self-pay

## 2020-02-05 LAB — NOVEL CORONAVIRUS, NAA: SARS-CoV-2, NAA: NOT DETECTED

## 2020-02-05 LAB — SPECIMEN STATUS REPORT

## 2020-02-05 LAB — SARS-COV-2, NAA 2 DAY TAT

## 2020-02-05 NOTE — Telephone Encounter (Signed)
-----   Message from Tamsen Roers, [place] sent at 02/05/2020 10:15 AM EST ----- Negative COVID test.

## 2020-02-05 NOTE — Telephone Encounter (Signed)
Patient was advised.  

## 2020-02-11 ENCOUNTER — Telehealth: Payer: Self-pay

## 2020-02-11 DIAGNOSIS — Z1152 Encounter for screening for COVID-19: Secondary | ICD-10-CM

## 2020-02-11 NOTE — Telephone Encounter (Signed)
Has a standing order in chart to be released.

## 2020-02-11 NOTE — Telephone Encounter (Signed)
Is it okay to order?   Thanks,   Vernona Rieger    Copied from CRM [id number]. Topic: General - Other >> Feb 11, 2020  9:08 AM Gwenlyn Fudge wrote: Reason for CRM: PT called and is requesting to have orders placed to receive a covid test today. Please advise.

## 2020-02-12 LAB — SPECIMEN STATUS REPORT

## 2020-02-12 LAB — SARS-COV-2, NAA 2 DAY TAT

## 2020-02-12 LAB — NOVEL CORONAVIRUS, NAA: SARS-CoV-2, NAA: NOT DETECTED

## 2020-02-17 ENCOUNTER — Other Ambulatory Visit: Payer: Self-pay | Admitting: Family Medicine

## 2020-02-17 ENCOUNTER — Telehealth: Payer: Self-pay

## 2020-02-17 DIAGNOSIS — Z1152 Encounter for screening for COVID-19: Secondary | ICD-10-CM | POA: Diagnosis not present

## 2020-02-17 DIAGNOSIS — I10 Essential (primary) hypertension: Secondary | ICD-10-CM

## 2020-02-17 NOTE — Telephone Encounter (Signed)
Copied from CRM [id number]. Topic: General - Other >> Feb 17, 2020 11:29 AM Lyn Hollingshead D wrote: PT need a COVID test / please advise

## 2020-02-19 LAB — NOVEL CORONAVIRUS, NAA: SARS-CoV-2, NAA: NOT DETECTED

## 2020-02-19 LAB — SARS-COV-2, NAA 2 DAY TAT

## 2020-02-19 LAB — SPECIMEN STATUS REPORT

## 2020-02-22 NOTE — Telephone Encounter (Signed)
Standing order in chart. So far, all tests are negative.

## 2020-02-25 ENCOUNTER — Other Ambulatory Visit: Payer: Self-pay

## 2020-02-25 DIAGNOSIS — Z1152 Encounter for screening for COVID-19: Secondary | ICD-10-CM

## 2020-02-26 LAB — NOVEL CORONAVIRUS, NAA: SARS-CoV-2, NAA: NOT DETECTED

## 2020-02-26 LAB — SARS-COV-2, NAA 2 DAY TAT

## 2020-03-02 ENCOUNTER — Telehealth: Payer: Self-pay

## 2020-03-02 DIAGNOSIS — Z1152 Encounter for screening for COVID-19: Secondary | ICD-10-CM

## 2020-03-02 NOTE — Telephone Encounter (Signed)
Copied from CRM [id number]. Topic: General - Other >> Mar 02, 2020  4:02 PM Gwenlyn Fudge wrote: Reason for CRM: PT calling and is requesting to have a covid test. Please advise.

## 2020-03-03 DIAGNOSIS — Z1152 Encounter for screening for COVID-19: Secondary | ICD-10-CM | POA: Diagnosis not present

## 2020-03-03 NOTE — Telephone Encounter (Signed)
Proceed with standing order in chart for COVID testing.

## 2020-03-04 LAB — SPECIMEN STATUS REPORT

## 2020-03-04 LAB — NOVEL CORONAVIRUS, NAA: SARS-CoV-2, NAA: NOT DETECTED

## 2020-03-04 LAB — SARS-COV-2, NAA 2 DAY TAT

## 2020-03-10 ENCOUNTER — Other Ambulatory Visit: Payer: Self-pay

## 2020-03-10 DIAGNOSIS — Z1152 Encounter for screening for COVID-19: Secondary | ICD-10-CM

## 2020-03-11 LAB — SARS-COV-2, NAA 2 DAY TAT

## 2020-03-11 LAB — SPECIMEN STATUS REPORT

## 2020-03-11 LAB — NOVEL CORONAVIRUS, NAA: SARS-CoV-2, NAA: NOT DETECTED

## 2020-03-16 ENCOUNTER — Telehealth: Payer: Self-pay

## 2020-03-16 ENCOUNTER — Other Ambulatory Visit: Payer: Self-pay

## 2020-03-16 DIAGNOSIS — Z1152 Encounter for screening for COVID-19: Secondary | ICD-10-CM

## 2020-03-16 NOTE — Telephone Encounter (Signed)
Order placed. Patient advised.  

## 2020-03-16 NOTE — Telephone Encounter (Signed)
Copied from CRM [id number]. Topic: General - Other >> Mar 16, 2020  1:49 PM Gwenlyn Fudge wrote: Reason for CRM: Pt called and is requesting to have a covid test in office tomorrow. Please advise.

## 2020-03-17 DIAGNOSIS — Z1152 Encounter for screening for COVID-19: Secondary | ICD-10-CM | POA: Diagnosis not present

## 2020-03-19 LAB — SARS-COV-2, NAA 2 DAY TAT

## 2020-03-19 LAB — NOVEL CORONAVIRUS, NAA: SARS-CoV-2, NAA: NOT DETECTED

## 2020-03-24 DIAGNOSIS — Z1152 Encounter for screening for COVID-19: Secondary | ICD-10-CM | POA: Diagnosis not present

## 2020-03-26 LAB — SARS-COV-2, NAA 2 DAY TAT

## 2020-03-26 LAB — NOVEL CORONAVIRUS, NAA: SARS-CoV-2, NAA: NOT DETECTED

## 2020-03-30 ENCOUNTER — Other Ambulatory Visit: Payer: Self-pay

## 2020-03-30 DIAGNOSIS — Z1152 Encounter for screening for COVID-19: Secondary | ICD-10-CM

## 2020-03-31 DIAGNOSIS — Z1152 Encounter for screening for COVID-19: Secondary | ICD-10-CM | POA: Diagnosis not present

## 2020-04-02 LAB — SARS-COV-2, NAA 2 DAY TAT

## 2020-04-02 LAB — NOVEL CORONAVIRUS, NAA: SARS-CoV-2, NAA: NOT DETECTED

## 2020-04-02 LAB — SPECIMEN STATUS REPORT

## 2020-04-07 ENCOUNTER — Other Ambulatory Visit: Payer: Self-pay | Admitting: Family Medicine

## 2020-04-07 DIAGNOSIS — Z1152 Encounter for screening for COVID-19: Secondary | ICD-10-CM | POA: Diagnosis not present

## 2020-04-10 LAB — NOVEL CORONAVIRUS, NAA: SARS-CoV-2, NAA: DETECTED — AB

## 2020-04-10 LAB — SPECIMEN STATUS REPORT

## 2020-04-11 ENCOUNTER — Telehealth: Payer: Self-pay | Admitting: Adult Health

## 2020-04-11 NOTE — Telephone Encounter (Signed)
Called to discuss with patient about COVID-19 symptoms and the use of one of the available treatments for those with mild to moderate Covid symptoms and at a high risk of hospitalization.  Pt appears to qualify for outpatient treatment due to co-morbid conditions and/or a member of an at-risk group in accordance with the FDA Emergency Use Authorization.    Symptom onset: 1/13 Vaccinated: no Booster? no Immunocompromised?  no Qualifiers: BMI, cardiac disease  Patient declines therapy at this point.  Gave him call back # should he worsen.    Noreene Filbert

## 2020-04-20 ENCOUNTER — Other Ambulatory Visit: Payer: Self-pay

## 2020-04-20 DIAGNOSIS — Z1152 Encounter for screening for COVID-19: Secondary | ICD-10-CM

## 2020-04-21 DIAGNOSIS — Z1152 Encounter for screening for COVID-19: Secondary | ICD-10-CM | POA: Diagnosis not present

## 2020-04-23 LAB — SPECIMEN STATUS REPORT

## 2020-04-23 LAB — NOVEL CORONAVIRUS, NAA: SARS-CoV-2, NAA: NOT DETECTED

## 2020-04-23 LAB — SARS-COV-2, NAA 2 DAY TAT

## 2020-04-28 ENCOUNTER — Other Ambulatory Visit: Payer: Self-pay

## 2020-04-28 DIAGNOSIS — Z1152 Encounter for screening for COVID-19: Secondary | ICD-10-CM

## 2020-04-29 LAB — SARS-COV-2, NAA 2 DAY TAT

## 2020-04-29 LAB — NOVEL CORONAVIRUS, NAA: SARS-CoV-2, NAA: NOT DETECTED

## 2020-05-04 ENCOUNTER — Other Ambulatory Visit: Payer: Self-pay

## 2020-05-04 DIAGNOSIS — Z1152 Encounter for screening for COVID-19: Secondary | ICD-10-CM

## 2020-05-05 DIAGNOSIS — Z1152 Encounter for screening for COVID-19: Secondary | ICD-10-CM | POA: Diagnosis not present

## 2020-05-07 LAB — NOVEL CORONAVIRUS, NAA: SARS-CoV-2, NAA: NOT DETECTED

## 2020-05-07 LAB — SARS-COV-2, NAA 2 DAY TAT

## 2020-05-12 ENCOUNTER — Other Ambulatory Visit: Payer: Self-pay

## 2020-05-12 DIAGNOSIS — Z1152 Encounter for screening for COVID-19: Secondary | ICD-10-CM | POA: Diagnosis not present

## 2020-05-13 LAB — SARS-COV-2, NAA 2 DAY TAT

## 2020-05-13 LAB — NOVEL CORONAVIRUS, NAA: SARS-CoV-2, NAA: NOT DETECTED

## 2020-05-19 ENCOUNTER — Other Ambulatory Visit: Payer: Self-pay

## 2020-05-19 DIAGNOSIS — Z1152 Encounter for screening for COVID-19: Secondary | ICD-10-CM

## 2020-05-20 LAB — SARS-COV-2, NAA 2 DAY TAT

## 2020-05-20 LAB — SPECIMEN STATUS REPORT

## 2020-05-20 LAB — NOVEL CORONAVIRUS, NAA: SARS-CoV-2, NAA: NOT DETECTED

## 2020-05-26 ENCOUNTER — Other Ambulatory Visit: Payer: Self-pay

## 2020-05-26 DIAGNOSIS — Z1152 Encounter for screening for COVID-19: Secondary | ICD-10-CM

## 2020-05-27 LAB — NOVEL CORONAVIRUS, NAA: SARS-CoV-2, NAA: NOT DETECTED

## 2020-05-27 LAB — SARS-COV-2, NAA 2 DAY TAT

## 2020-06-01 ENCOUNTER — Telehealth: Payer: Self-pay

## 2020-06-01 NOTE — Telephone Encounter (Signed)
I called pt and pt verbalized understanding of information below.  

## 2020-06-01 NOTE — Telephone Encounter (Signed)
-----   Message from Tamsen Roers, PA-C sent at 05/30/2020  3:09 PM EST ----- Negative COVID test.

## 2020-08-17 ENCOUNTER — Other Ambulatory Visit: Payer: Self-pay | Admitting: Family Medicine

## 2020-08-17 DIAGNOSIS — I10 Essential (primary) hypertension: Secondary | ICD-10-CM

## 2020-08-17 NOTE — Telephone Encounter (Signed)
Attempted to call patient to schedule annual exam- he has been getting RF on medication that require 6 month follow up- labs due as well. Left message to call office for appointment and courtesy #30 day supply given.

## 2020-09-14 ENCOUNTER — Other Ambulatory Visit: Payer: Self-pay | Admitting: Family Medicine

## 2020-09-14 DIAGNOSIS — I10 Essential (primary) hypertension: Secondary | ICD-10-CM

## 2020-09-14 NOTE — Telephone Encounter (Signed)
Notes to clinic: courtesy refill already given Patient has not called back to schedule appt    Requested Prescriptions  Pending Prescriptions Disp Refills   hydrochlorothiazide (HYDRODIURIL) 25 MG tablet [Pharmacy Med Name: HYDROCHLOROTHIAZIDE 25 MG TAB] 30 tablet 0    Sig: TAKE 1 TABLET BY MOUTH EVERY DAY      Cardiovascular: Diuretics - Thiazide Failed - 09/14/2020  8:31 AM      Failed - Ca in normal range and within 360 days    Calcium  Date Value Ref Range Status  06/05/2019 9.2 8.7 - 10.2 mg/dL Final          Failed - Cr in normal range and within 360 days    Creatinine, Ser  Date Value Ref Range Status  06/05/2019 0.97 0.76 - 1.27 mg/dL Final          Failed - K in normal range and within 360 days    Potassium  Date Value Ref Range Status  06/05/2019 3.9 3.5 - 5.2 mmol/L Final          Failed - Na in normal range and within 360 days    Sodium  Date Value Ref Range Status  06/05/2019 138 134 - 144 mmol/L Final          Failed - Last BP in normal range    BP Readings from Last 1 Encounters:  06/05/19 (!) 148/84          Failed - Valid encounter within last 6 months    Recent Outpatient Visits           1 year ago Annual physical exam   [employer], Jodell Cipro, PA-C   1 year ago Dysfunction of left eustachian tube   [employer], Jodell Cipro, PA-C   1 year ago Dysfunction of left eustachian tube   [facility] [place], [place], [place]   2 years ago Mixed hyperlipidemia   [employer], Jodell Cipro, PA-C   2 years ago Essential (primary) hypertension   [employer], Jodell Cipro, PA-C                  lisinopril (ZESTRIL) 30 MG tablet [Pharmacy Med Name: LISINOPRIL 30 MG TABLET] 30 tablet 0    Sig: TAKE 1 TABLET BY MOUTH EVERY DAY      Cardiovascular:  ACE Inhibitors Failed - 09/14/2020  8:31 AM      Failed - Cr in normal range and within 180 days     Creatinine, Ser  Date Value Ref Range Status  06/05/2019 0.97 0.76 - 1.27 mg/dL Final          Failed - K in normal range and within 180 days    Potassium  Date Value Ref Range Status  06/05/2019 3.9 3.5 - 5.2 mmol/L Final          Failed - Last BP in normal range    BP Readings from Last 1 Encounters:  06/05/19 (!) 148/84          Failed - Valid encounter within last 6 months    Recent Outpatient Visits           1 year ago Annual physical exam   [employer], Jodell Cipro, PA-C   1 year ago Dysfunction of left eustachian tube   [facility] Chrismon, Jodell Cipro, PA-C   1 year ago Dysfunction of left eustachian tube   [facility] [place], Alessandra [place], [place]   2 years ago Mixed hyperlipidemia   [facility] Chrismon, Jodell Cipro, PA-C   2 years ago Essential (primary) hypertension   [employer], Jodell Cipro, [place]                Passed - Patient is not pregnant

## 2020-09-14 NOTE — Telephone Encounter (Signed)
  Notes to clinic: Courtesy has been given No apt schedule    Requested Prescriptions  Pending Prescriptions Disp Refills   atorvastatin (LIPITOR) 20 MG tablet [Pharmacy Med Name: ATORVASTATIN 20 MG TABLET] 30 tablet 0    Sig: TAKE 1 TABLET BY MOUTH EVERY DAY      Cardiovascular:  Antilipid - Statins Failed - 09/14/2020  8:35 AM      Failed - Total Cholesterol in normal range and within 360 days    Cholesterol, Total  Date Value Ref Range Status  06/05/2019 139 100 - 199 mg/dL Final          Failed - LDL in normal range and within 360 days    LDL Chol Calc (NIH)  Date Value Ref Range Status  06/05/2019 87 0 - 99 mg/dL Final          Failed - HDL in normal range and within 360 days    HDL  Date Value Ref Range Status  06/05/2019 34 (L) >39 mg/dL Final          Failed - Triglycerides in normal range and within 360 days    Triglycerides  Date Value Ref Range Status  06/05/2019 92 0 - 149 mg/dL Final          Failed - Valid encounter within last 12 months    Recent Outpatient Visits           1 year ago Annual physical exam   [employer], Jodell Cipro, PA-C   1 year ago Dysfunction of left eustachian tube   [facility] Chrismon, Jodell Cipro, PA-C   1 year ago Dysfunction of left eustachian tube   [facility] [place], Alessandra [place], [place]   2 years ago Mixed hyperlipidemia   [employer], Jodell Cipro, PA-C   2 years ago Essential (primary) hypertension   [employer], Jodell Cipro, PA-C                Passed - Patient is not pregnant

## 2020-09-27 DIAGNOSIS — Z20822 Contact with and (suspected) exposure to covid-19: Secondary | ICD-10-CM | POA: Diagnosis not present

## 2020-09-27 DIAGNOSIS — Z03818 Encounter for observation for suspected exposure to other biological agents ruled out: Secondary | ICD-10-CM | POA: Diagnosis not present

## 2020-10-11 ENCOUNTER — Other Ambulatory Visit: Payer: Self-pay | Admitting: Family Medicine

## 2020-10-11 DIAGNOSIS — I10 Essential (primary) hypertension: Secondary | ICD-10-CM

## 2020-10-11 NOTE — Telephone Encounter (Signed)
Notes to clinic:  due for follow up appointment  Several attempts have been made to schedule    Requested Prescriptions  Pending Prescriptions Disp Refills   atorvastatin (LIPITOR) 20 MG tablet [Pharmacy Med Name: ATORVASTATIN 20 MG TABLET] 30 tablet 0    Sig: Take 1 tablet (20 mg total) by mouth daily. Please schedule an office visit before anymore refills.      Cardiovascular:  Antilipid - Statins Failed - 10/11/2020  8:35 AM      Failed - Total Cholesterol in normal range and within 360 days    Cholesterol, Total  Date Value Ref Range Status  06/05/2019 139 100 - 199 mg/dL Final          Failed - LDL in normal range and within 360 days    LDL Chol Calc (NIH)  Date Value Ref Range Status  06/05/2019 87 0 - 99 mg/dL Final          Failed - HDL in normal range and within 360 days    HDL  Date Value Ref Range Status  06/05/2019 34 (L) >39 mg/dL Final          Failed - Triglycerides in normal range and within 360 days    Triglycerides  Date Value Ref Range Status  06/05/2019 92 0 - 149 mg/dL Final          Failed - Valid encounter within last 12 months    Recent Outpatient Visits           1 year ago Annual physical exam   [employer], Jodell Cipro, PA-C   1 year ago Dysfunction of left eustachian tube   [employer], Jodell Cipro, PA-C   1 year ago Dysfunction of left eustachian tube   [facility] [place], [place], [place]   2 years ago Mixed hyperlipidemia   [employer], Jodell Cipro, PA-C   2 years ago Essential (primary) hypertension   [employer], Jodell Cipro, PA-C                Passed - Patient is not pregnant        lisinopril (ZESTRIL) 30 MG tablet [Pharmacy Med Name: LISINOPRIL 30 MG TABLET] 30 tablet 0    Sig: Take 1 tablet (30 mg total) by mouth daily. Please schedule an office visit before anymore refills.      Cardiovascular:  ACE Inhibitors  Failed - 10/11/2020  8:35 AM      Failed - Cr in normal range and within 180 days    Creatinine, Ser  Date Value Ref Range Status  06/05/2019 0.97 0.76 - 1.27 mg/dL Final          Failed - K in normal range and within 180 days    Potassium  Date Value Ref Range Status  06/05/2019 3.9 3.5 - 5.2 mmol/L Final          Failed - Last BP in normal range    BP Readings from Last 1 Encounters:  06/05/19 (!) 148/84          Failed - Valid encounter within last 6 months    Recent Outpatient Visits           1 year ago Annual physical exam   [employer], Jodell Cipro, PA-C   1 year ago Dysfunction of left eustachian tube   [facility] Chrismon, Jodell Cipro, PA-C   1 year ago  Dysfunction of left eustachian tube   [facility] [place], Alessandra [place], [place]   2 years ago Mixed hyperlipidemia   [facility] Chrismon, Jodell Cipro, [place]   2 years ago Essential (primary) hypertension   [employer], Jodell Cipro, [address] - Patient is not pregnant        hydrochlorothiazide (HYDRODIURIL) 25 MG tablet [Pharmacy Med Name: HYDROCHLOROTHIAZIDE 25 MG TAB] 30 tablet 0    Sig: Take 1 tablet (25 mg total) by mouth daily. Please schedule an office visit before anymore refills.      Cardiovascular: Diuretics - Thiazide Failed - 10/11/2020  8:35 AM      Failed - Ca in normal range and within 360 days    Calcium  Date Value Ref Range Status  06/05/2019 9.2 8.7 - 10.2 mg/dL Final          Failed - Cr in normal range and within 360 days    Creatinine, Ser  Date Value Ref Range Status  06/05/2019 0.97 0.76 - 1.27 mg/dL Final          Failed - K in normal range and within 360 days    Potassium  Date Value Ref Range Status  06/05/2019 3.9 3.5 - 5.2 mmol/L Final          Failed - Na in normal range and within 360 days    Sodium  Date Value Ref Range Status  06/05/2019 138 134 - 144 mmol/L  Final          Failed - Last BP in normal range    BP Readings from Last 1 Encounters:  06/05/19 (!) 148/84          Failed - Valid encounter within last 6 months    Recent Outpatient Visits           1 year ago Annual physical exam   [employer], Jodell Cipro, PA-C   1 year ago Dysfunction of left eustachian tube   [employer], Jodell Cipro, PA-C   1 year ago Dysfunction of left eustachian tube   [facility] [place], Alessandra [place], [place]   2 years ago Mixed hyperlipidemia   [employer], Jodell Cipro, PA-C   2 years ago Essential (primary) hypertension   [employer], Jodell Cipro, [place]

## 2020-10-27 ENCOUNTER — Other Ambulatory Visit: Payer: Self-pay | Admitting: Family Medicine

## 2020-10-27 DIAGNOSIS — I10 Essential (primary) hypertension: Secondary | ICD-10-CM

## 2020-10-31 NOTE — Telephone Encounter (Signed)
Appt scheduled 11/17/20.

## 2020-11-07 NOTE — Addendum Note (Signed)
Addended by: Stormy Card L on: 11/07/2020 02:00 PM   Modules accepted: Orders

## 2020-11-07 NOTE — Telephone Encounter (Signed)
Pt is calling because he has not received medication refill up until he scheduled ov on 11/17/20. Please advise [facility]acy[facility] [place] [place]

## 2020-11-07 NOTE — Telephone Encounter (Signed)
Notes to clinic:  Patient has scheduled appt on 11/17/2020 Would like refills until that time    Requested Prescriptions  Pending Prescriptions Disp Refills   atorvastatin (LIPITOR) 20 MG tablet 15 tablet 0    Sig: Take by mouth daily. Please schedule an office visit before anymore refills.     Cardiovascular:  Antilipid - Statins Failed - 11/07/2020  2:00 PM      Failed - Total Cholesterol in normal range and within 360 days    Cholesterol, Total  Date Value Ref Range Status  06/05/2019 139 100 - 199 mg/dL Final          Failed - LDL in normal range and within 360 days    LDL Chol Calc (NIH)  Date Value Ref Range Status  06/05/2019 87 0 - 99 mg/dL Final          Failed - HDL in normal range and within 360 days    HDL  Date Value Ref Range Status  06/05/2019 34 (L) >39 mg/dL Final          Failed - Triglycerides in normal range and within 360 days    Triglycerides  Date Value Ref Range Status  06/05/2019 92 0 - 149 mg/dL Final          Failed - Valid encounter within last 12 months    Recent Outpatient Visits           1 year ago [facility] physical exam   [facility] Jodell Ciproennis E, PA-C   1 year ago Dysfunction of left eustachian tube   [facility] Jodell Ciproennis E, PA-C   2 years ago Dysfunction of left eustachian tube   [facility] [place], [place]   2 years ago Mixed hyperlipidemia   [facility], Jodell Ciproennis E, PA-C   2 years ago Essential (primary) hypertension   [facility], Jodell Ciproennis E, PA-C       Future Appointments             In 1 week Chrismon, Jodell Ciproennis E, [facility]-C [facility]            Passed - Patient is not pregnant       lisinopril (ZESTRIL) 30 MG tablet 15 tablet 0    Sig: Take 1 tablet (30 mg total) by mouth daily. Please schedule an office visit before anymore refills.     Cardiovascular:  ACE Inhibitors Failed -  11/07/2020  2:00 PM      Failed - Cr in normal range and within 180 days    Creatinine, Ser  Date Value Ref Range Status  06/05/2019 0.97 0.76 - 1.27 mg/dL Final          Failed - K in normal range and within 180 days    Potassium  Date Value Ref Range Status  06/05/2019 3.9 3.5 - 5.2 mmol/L Final          Failed - Last BP in normal range    BP Readings from Last 1 Encounters:  06/05/19 (!) 148/84          Failed - Valid encounter within last 6 months    Recent Outpatient Visits           1 year ago Annual physical exam   [facility], Jodell Ciproennis E, PA-C   1 year ago Dysfunction of left eustachian tube   [facility] Jodell Ciproennis E, PA-C  2 years ago Dysfunction of left eustachian tube   [facility] [place], [place], [place]   2 years ago Mixed hyperlipidemia   [facility] Chrismon, Jodell Cipro, PA-C   2 years ago Essential (primary) hypertension   [employer], Jodell Cipro, PA-C       Future Appointments             In 1 week Chrismon, Jodell Cipro, PA-C [facility], [facility]            Passed - Patient is not pregnant       hydrochlorothiazide (HYDRODIURIL) 25 MG tablet 15 tablet 0    Sig: Take by mouth daily. Please schedule an office visit before anymore refills.     Cardiovascular: Diuretics - Thiazide Failed - 11/07/2020  2:00 PM      Failed - Ca in normal range and within 360 days    Calcium  Date Value Ref Range Status  06/05/2019 9.2 8.7 - 10.2 mg/dL Final          Failed - Cr in normal range and within 360 days    Creatinine, Ser  Date Value Ref Range Status  06/05/2019 0.97 0.76 - 1.27 mg/dL Final          Failed - K in normal range and within 360 days    Potassium  Date Value Ref Range Status  06/05/2019 3.9 3.5 - 5.2 mmol/L Final          Failed - Na in normal range and within 360 days    Sodium  Date Value Ref Range Status  06/05/2019  138 134 - 144 mmol/L Final          Failed - Last BP in normal range    BP Readings from Last 1 Encounters:  06/05/19 (!) 148/84          Failed - Valid encounter within last 6 months    Recent Outpatient Visits           1 year ago Annual physical exam   [employer], Jodell Cipro, PA-C   1 year ago Dysfunction of left eustachian tube   [employer], Jodell Cipro, PA-C   2 years ago Dysfunction of left eustachian tube   [facility] [place], Alessandra [place], [place]   2 years ago Mixed hyperlipidemia   [employer], Jodell Cipro, PA-C   2 years ago Essential (primary) hypertension   [employer], Jodell Cipro, PA-C       Future Appointments             In 1 week Chrismon, Jodell Cipro, PA-C [facility]            Refused Prescriptions Disp Refills   atorvastatin (LIPITOR) 20 MG tablet [Pharmacy Med Name: ATORVASTATIN 20 MG TABLET] 15 tablet 0    Sig: TAKE 1 TABLET (20 MG TOTAL) BY MOUTH DAILY. PLEASE SCHEDULE AN OFFICE VISIT BEFORE ANYMORE REFILLS.     Cardiovascular:  Antilipid - Statins Failed - 11/07/2020  2:00 PM      Failed - Total Cholesterol in normal range and within 360 days    Cholesterol, Total  Date Value Ref Range Status  06/05/2019 139 100 - 199 mg/dL Final          Failed - LDL in normal range and within 360 days    LDL Chol Calc (NIH)  Date Value Ref  Range Status  06/05/2019 87 0 - 99 mg/dL Final          Failed - HDL in normal range and within 360 days    HDL  Date Value Ref Range Status  06/05/2019 34 (L) >39 mg/dL Final          Failed - Triglycerides in normal range and within 360 days    Triglycerides  Date Value Ref Range Status  06/05/2019 92 0 - 149 mg/dL Final          Failed - Valid encounter within last 12 months    Recent Outpatient Visits           1 year ago Annual physical exam   [employer], Jodell Cipro, PA-C   1 year ago Dysfunction of left eustachian tube   [facility] Chrismon, Jodell Cipro, PA-C   2 years ago Dysfunction of left eustachian tube   [facility] [place], [place], [place]   2 years ago Mixed hyperlipidemia   [employer], Jodell Cipro, PA-C   2 years ago Essential (primary) hypertension   [employer], Jodell Cipro, PA-C       Future Appointments             In 1 week Chrismon, Jodell Cipro, PA-C [facility]            Passed - Patient is not pregnant       hydrochlorothiazide (HYDRODIURIL) 25 MG tablet [Pharmacy Med Name: HYDROCHLOROTHIAZIDE 25 MG TAB] 15 tablet 0    Sig: TAKE 1 TABLET (25 MG TOTAL) BY MOUTH DAILY. PLEASE SCHEDULE AN OFFICE VISIT BEFORE ANYMORE REFILLS.     Cardiovascular: Diuretics - Thiazide Failed - 11/07/2020  2:00 PM      Failed - Ca in normal range and within 360 days    Calcium  Date Value Ref Range Status  06/05/2019 9.2 8.7 - 10.2 mg/dL Final          Failed - Cr in normal range and within 360 days    Creatinine, Ser  Date Value Ref Range Status  06/05/2019 0.97 0.76 - 1.27 mg/dL Final          Failed - K in normal range and within 360 days    Potassium  Date Value Ref Range Status  06/05/2019 3.9 3.5 - 5.2 mmol/L Final          Failed - Na in normal range and within 360 days    Sodium  Date Value Ref Range Status  06/05/2019 138 134 - 144 mmol/L Final          Failed - Last BP in normal range    BP Readings from Last 1 Encounters:  06/05/19 (!) 148/84          Failed - Valid encounter within last 6 months    Recent Outpatient Visits           1 year ago Annual physical exam   [employer], Jodell Cipro, PA-C   1 year ago Dysfunction of left eustachian tube   [employer], Jodell Cipro, PA-C   2 years ago Dysfunction of left eustachian tube   [facility] [place], [place]andra [place], [place]   2 years ago Mixed hyperlipidemia   [employer], Jodell Cipro, PA-C   2 years ago Essential (primary) hypertension   [employer], Jodell Cipro,  PA-C       Future Appointments             In 1 week Chrismon, Jodell Cipro, PA-C [facility]             lisinopril (ZESTRIL) 30 MG tablet [Pharmacy Med Name: LISINOPRIL 30 MG TABLET] 15 tablet 0    Sig: TAKE 1 TABLET (30 MG TOTAL) BY MOUTH DAILY. PLEASE SCHEDULE AN OFFICE VISIT BEFORE ANYMORE REFILLS.     Cardiovascular:  ACE Inhibitors Failed - 11/07/2020  2:00 PM      Failed - Cr in normal range and within 180 days    Creatinine, Ser  Date Value Ref Range Status  06/05/2019 0.97 0.76 - 1.27 mg/dL Final          Failed - K in normal range and within 180 days    Potassium  Date Value Ref Range Status  06/05/2019 3.9 3.5 - 5.2 mmol/L Final          Failed - Last BP in normal range    BP Readings from Last 1 Encounters:  06/05/19 (!) 148/84          Failed - Valid encounter within last 6 months    Recent Outpatient Visits           1 year ago Annual physical exam   [employer], Jodell Cipro, PA-C   1 year ago Dysfunction of left eustachian tube   [facility] Chrismon, Jodell Cipro, PA-C   2 years ago Dysfunction of left eustachian tube   [facility] [place], [place]andra [place], [place]   2 years ago Mixed hyperlipidemia   [employer], Jodell Cipro, PA-C   2 years ago Essential (primary) hypertension   [employer], Jodell Cipro, PA-C       Future Appointments             In 1 week Chrismon, Jodell Cipro, PA-C [facility]            Passed - Patient is not pregnant

## 2020-11-08 ENCOUNTER — Other Ambulatory Visit: Payer: Self-pay | Admitting: Family Medicine

## 2020-11-08 DIAGNOSIS — I10 Essential (primary) hypertension: Secondary | ICD-10-CM

## 2020-11-08 NOTE — Telephone Encounter (Signed)
Notes to clinic:  Patient has appt scheduled for 11/17/2020 Review for short supply     Requested Prescriptions  Pending Prescriptions Disp Refills   hydrochlorothiazide (HYDRODIURIL) 25 MG tablet [Pharmacy Med Name: HYDROCHLOROTHIAZIDE 25 MG TAB] 15 tablet 0    Sig: TAKE 1 TABLET (25 MG TOTAL) BY MOUTH DAILY. PLEASE SCHEDULE AN OFFICE VISIT BEFORE ANYMORE REFILLS.     Cardiovascular: Diuretics - Thiazide Failed - 11/08/2020  8:46 AM      Failed - Ca in normal range and within 360 days    Calcium  Date Value Ref Range Status  06/05/2019 9.2 8.7 - 10.2 mg/dL Final          Failed - Cr in normal range and within 360 days    Creatinine, Ser  Date Value Ref Range Status  06/05/2019 0.97 0.76 - 1.27 mg/dL Final          Failed - K in normal range and within 360 days    Potassium  Date Value Ref Range Status  06/05/2019 3.9 3.5 - 5.2 mmol/L Final          Failed - Na in normal range and within 360 days    Sodium  Date Value Ref Range Status  06/05/2019 138 134 - 144 mmol/L Final          Failed - Last BP in normal range    BP Readings from Last 1 Encounters:  06/05/19 (!) 148/84          Failed - Valid encounter within last 6 months    Recent Outpatient Visits           1 year ago Annual physical exam   [employer], Jodell Cipro, PA-C   1 year ago Dysfunction of left eustachian tube   [employer], Jodell Cipro, PA-C   2 years ago Dysfunction of left eustachian tube   [facility] [place], Alessandra Bevels, [place]   2 years ago Mixed hyperlipidemia   [employer], Jodell Cipro, PA-C   2 years ago Essential (primary) hypertension   [employer], Jodell Cipro, PA-C       Future Appointments             In 1 week Chrismon, Jodell Cipro, PA-C [facility]             atorvastatin (LIPITOR) 20 MG tablet [employer] Med Name: ATORVASTATIN 20 MG TABLET] 15  tablet 0    Sig: TAKE 1 TABLET (20 MG TOTAL) BY MOUTH DAILY. PLEASE SCHEDULE AN OFFICE VISIT BEFORE ANYMORE REFILLS.     Cardiovascular:  Antilipid - Statins Failed - 11/08/2020  8:46 AM      Failed - Total Cholesterol in normal range and within 360 days    Cholesterol, Total  Date Value Ref Range Status  06/05/2019 139 100 - 199 mg/dL Final          Failed - LDL in normal range and within 360 days    LDL Chol Calc (NIH)  Date Value Ref Range Status  06/05/2019 87 0 - 99 mg/dL Final          Failed - HDL in normal range and within 360 days    HDL  Date Value Ref Range Status  06/05/2019 34 (L) >39 mg/dL Final          Failed - Triglycerides in normal range and within 360 days    Triglycerides  Date Value Ref Range Status  06/05/2019 92 0 - 149 mg/dL Final          Failed - Valid encounter within last 12 months    Recent Outpatient Visits           1 year ago Annual physical exam   [facility] Chrismon, Jodell Cipro, PA-C   1 year ago Dysfunction of left eustachian tube   [facility] Chrismon, Jodell Cipro, PA-C   2 years ago Dysfunction of left eustachian tube   [facility] [place], Alessandra [place]vel[place], [place]   2 years ago Mixed hyperlipidemia   [employer], Jodell Cipro, PA-C   2 years ago Essential (primary) hypertension   [employer], Jodell Cipro, PA-C       Future Appointments             In 1 week Chrismon, Jodell Cipro, PA-C [facility]            Passed - Patient is not pregnant       lisinopril (ZESTRIL) 30 MG tablet [Pharmacy Med Name: LISINOPRIL 30 MG TABLET] 15 tablet 0    Sig: TAKE 1 TABLET (30 MG TOTAL) BY MOUTH DAILY. PLEASE SCHEDULE AN OFFICE VISIT BEFORE ANYMORE REFILLS.     Cardiovascular:  ACE Inhibitors Failed - 11/08/2020  8:46 AM      Failed - Cr in normal range and within 180 days    Creatinine, Ser  Date Value Ref Range Status  06/05/2019 0.97  0.76 - 1.27 mg/dL Final          Failed - K in normal range and within 180 days    Potassium  Date Value Ref Range Status  06/05/2019 3.9 3.5 - 5.2 mmol/L Final          Failed - Last BP in normal range    BP Readings from Last 1 Encounters:  06/05/19 (!) 148/84          Failed - Valid encounter within last 6 months    Recent Outpatient Visits           1 year ago Annual physical exam   [employer], Jodell Cipro, PA-C   1 year ago Dysfunction of left eustachian tube   [facility] Chrismon, Jodell Cipro, PA-C   2 years ago Dysfunction of left eustachian tube   [facility] [place], Alessandra Bevel[place], [place]   2 years ago Mixed hyperlipidemia   [employer], Jodell Cipro, PA-C   2 years ago Essential (primary) hypertension   [employer], Jodell Cipro, PA-C       Future Appointments             In 1 week Chrismon, Jodell Cipro, PA-C [facility]            Passed - Patient is not pregnant

## 2020-11-15 ENCOUNTER — Other Ambulatory Visit: Payer: Self-pay | Admitting: Family Medicine

## 2020-11-15 DIAGNOSIS — I10 Essential (primary) hypertension: Secondary | ICD-10-CM

## 2020-11-15 NOTE — Telephone Encounter (Signed)
Notes to clinic:  Patient has appt on 11/17/2020 Review for refill    Requested Prescriptions  Pending Prescriptions Disp Refills   lisinopril (ZESTRIL) 30 MG tablet [Pharmacy Med Name: LISINOPRIL 30 MG TABLET] 15 tablet 0    Sig: TAKE 1 TABLET (30 MG TOTAL) BY MOUTH DAILY. PLEASE SCHEDULE AN OFFICE VISIT BEFORE ANYMORE REFILLS.     Cardiovascular:  ACE Inhibitors Failed - 11/15/2020  2:33 PM      Failed - Cr in normal range and within 180 days    Creatinine, Ser  Date Value Ref Range Status  06/05/2019 0.97 0.76 - 1.27 mg/dL Final          Failed - K in normal range and within 180 days    Potassium  Date Value Ref Range Status  06/05/2019 3.9 3.5 - 5.2 mmol/L Final          Failed - Last BP in normal range    BP Readings from Last 1 Encounters:  06/05/19 (!) 148/84          Failed - Valid encounter within last 6 months    Recent Outpatient Visits           1 year ago Annual physical exam   [employer], Jodell Cipro, PA-C   1 year ago Dysfunction of left eustachian tube   [employer], Jodell Cipro, PA-C   2 years ago Dysfunction of left eustachian tube   [facility] [place], Alessandra Bevel[place], [place]   2 years ago Mixed hyperlipidemia   [employer], Jodell Cipro, PA-C   2 years ago Essential (primary) hypertension   [employer], Jodell Cipro, PA-C       Future Appointments             In 2 days Chrismon, Jodell Cipro, PA-C [facility]            Passed - Patient is not pregnant       hydrochlorothiazide (HYDRODIURIL) 25 MG tablet [Pharmacy Med Name: HYDROCHLOROTHIAZIDE 25 MG TAB] 15 tablet 0    Sig: TAKE 1 TABLET (25 MG TOTAL) BY MOUTH DAILY. PLEASE SCHEDULE AN OFFICE VISIT BEFORE ANYMORE REFILLS.     Cardiovascular: Diuretics - Thiazide Failed - 11/15/2020  2:33 PM      Failed - Ca in normal range and within 360 days    Calcium  Date Value Ref  Range Status  06/05/2019 9.2 8.7 - 10.2 mg/dL Final          Failed - Cr in normal range and within 360 days    Creatinine, Ser  Date Value Ref Range Status  06/05/2019 0.97 0.76 - 1.27 mg/dL Final          Failed - K in normal range and within 360 days    Potassium  Date Value Ref Range Status  06/05/2019 3.9 3.5 - 5.2 mmol/L Final          Failed - Na in normal range and within 360 days    Sodium  Date Value Ref Range Status  06/05/2019 138 134 - 144 mmol/L Final          Failed - Last BP in normal range    BP Readings from Last 1 Encounters:  06/05/19 (!) 148/84          Failed - Valid encounter within last 6 months    Recent Outpatient Visits  1 year ago Annual physical exam   [facility] Chrismon, Jodell Cipro, PA-C   1 year ago Dysfunction of left eustachian tube   [facility] Chrismon, Jodell Cipro, PA-C   2 years ago Dysfunction of left eustachian tube   [facility] [place], Alessandra [place], [place]   2 years ago Mixed hyperlipidemia   [employer], Jodell Cipro, PA-C   2 years ago Essential (primary) hypertension   [employer], Jodell Cipro, PA-C       Future Appointments             In 2 days Chrismon, Jodell Cipro, PA-C [facility]

## 2020-11-15 NOTE — Telephone Encounter (Signed)
  Notes to clinic:  Patient has appt on 11/17/2020 Review for refill    Requested Prescriptions  Pending Prescriptions Disp Refills   atorvastatin (LIPITOR) 20 MG tablet [Pharmacy Med Name: ATORVASTATIN 20 MG TABLET] 15 tablet 0    Sig: TAKE 1 TABLET (20 MG TOTAL) BY MOUTH DAILY. PLEASE SCHEDULE AN OFFICE VISIT BEFORE ANYMORE REFILLS.     Cardiovascular:  Antilipid - Statins Failed - 11/15/2020  2:35 PM      Failed - Total Cholesterol in normal range and within 360 days    Cholesterol, Total  Date Value Ref Range Status  06/05/2019 139 100 - 199 mg/dL Final          Failed - LDL in normal range and within 360 days    LDL Chol Calc (NIH)  Date Value Ref Range Status  06/05/2019 87 0 - 99 mg/dL Final          Failed - HDL in normal range and within 360 days    HDL  Date Value Ref Range Status  06/05/2019 34 (L) >39 mg/dL Final          Failed - Triglycerides in normal range and within 360 days    Triglycerides  Date Value Ref Range Status  06/05/2019 92 0 - 149 mg/dL Final          Failed - Valid encounter within last 12 months    Recent Outpatient Visits           1 year ago Annual physical exam   [employer], Jodell Cipro, PA-C   1 year ago Dysfunction of left eustachian tube   [facility] Chrismon, Jodell Cipro, PA-C   2 years ago Dysfunction of left eustachian tube   [facility] [place], Alessandra [place], [place]   2 years ago Mixed hyperlipidemia   [employer], Jodell Cipro, PA-C   2 years ago Essential (primary) hypertension   [employer], Jodell Cipro, PA-C       Future Appointments             In 2 days Chrismon, Jodell Cipro, PA-C [facility]            Passed - Patient is not pregnant

## 2020-11-17 ENCOUNTER — Ambulatory Visit: Payer: BC Managed Care – PPO | Admitting: Family Medicine

## 2020-11-17 ENCOUNTER — Encounter: Payer: Self-pay | Admitting: Family Medicine

## 2020-11-17 ENCOUNTER — Other Ambulatory Visit: Payer: Self-pay

## 2020-11-17 VITALS — BP 164/89 | HR 85 | Temp 99.0°F | Wt 303.0 lb

## 2020-11-17 DIAGNOSIS — I10 Essential (primary) hypertension: Secondary | ICD-10-CM

## 2020-11-17 DIAGNOSIS — I872 Venous insufficiency (chronic) (peripheral): Secondary | ICD-10-CM | POA: Diagnosis not present

## 2020-11-17 DIAGNOSIS — E782 Mixed hyperlipidemia: Secondary | ICD-10-CM

## 2020-11-17 NOTE — Progress Notes (Signed)
Established patient visit   Patient: Travis D Sowle Jr.   DOB: 1965/11/13   55 y.o. Male  MRN: [id number] Visit Date: 11/17/2020  Today's healthcare provider: Dortha Kern, PA-C   No chief complaint on file.  Subjective  -------------------------------------------------------------------------------------------------------------------- HPI  Hypertension, follow-up  BP Readings from Last 3 Encounters:  11/17/20 (!) 164/89  06/05/19 (!) 148/84  02/03/19 138/70   Wt Readings from Last 3 Encounters:  11/17/20 (!) 303 lb (137.4 kg)  06/05/19 294 lb 9.6 oz (133.6 kg)  02/03/19 289 lb (131.1 kg)     He was last seen for hypertension 18 months ago.  BP at that visit was as above. Management since that visit includes nine.  He reports good compliance with treatment. He is not having side effects.  He is following a Regular diet. He is not exercising. He does not smoke.  Use of agents associated with hypertension: none.   Outside blood pressures are not being checked. Symptoms: No chest pain No chest pressure  No palpitations No syncope  No dyspnea No orthopnea  No paroxysmal nocturnal dyspnea No lower extremity edema   Pertinent labs: Lab Results  Component Value Date   CHOL 139 06/05/2019   HDL 34 (L) 06/05/2019   LDLCALC 87 06/05/2019   TRIG 92 06/05/2019   CHOLHDL 4.1 06/05/2019   Lab Results  Component Value Date   NA 138 06/05/2019   K 3.9 06/05/2019   CREATININE 0.97 06/05/2019   GFRNONAA 90 06/05/2019   GFRAA 104 06/05/2019   GLUCOSE 94 06/05/2019     The 10-year ASCVD risk score Denman George DC Jr., et al., 2013) is: 7.6%   --------------------------------------------------------------------------------------------------- Lipid/Cholesterol, Follow-up  Last lipid panel Other pertinent labs  Lab Results  Component Value Date   CHOL 139 06/05/2019   HDL 34 (L) 06/05/2019   LDLCALC 87 06/05/2019   TRIG 92 06/05/2019   CHOLHDL 4.1 06/05/2019   Lab  Results  Component Value Date   ALT 20 06/05/2019   AST 17 06/05/2019   PLT 211 06/05/2019   TSH 1.720 06/05/2019     He was last seen for this 18 months ago.  Management since that visit includes none.  He reports good compliance with treatment. Heis not having side effects.   Symptoms: No chest pain No chest pressure/discomfort  No dyspnea No lower extremity edema  No numbness or tingling of extremity No orthopnea  No palpitations No paroxysmal nocturnal dyspnea  No speech difficulty No syncope   Current diet:  avoids fried foods but has the problem of eating late Current exercise: none  The 10-year ASCVD risk score Denman George DC Jr., et al., 2013) is: 7.6%  --------------------------------------------------------------------------------------------------- No past medical history on file. Past Surgical History:  Procedure Laterality Date   NO PAST SURGERIES     Family History  Problem Relation Age of Onset   Cancer Mother    Cancer Maternal Grandmother    Cancer Maternal Grandfather    Cancer Paternal Grandmother    Social History   Tobacco Use   Smoking status: Never   Smokeless tobacco: Never  Vaping Use   Vaping Use: Never used  Substance Use Topics   Alcohol use: Yes    Alcohol/week: 0.0 standard drinks    Comment: occasionally- drinks twice a day and drinks 4-6 beers   Drug use: No   No Known Allergies    Medications: Outpatient Medications Prior to Visit  Medication Sig   atorvastatin (LIPITOR)  20 MG tablet TAKE 1 TABLET (20 MG TOTAL) BY MOUTH DAILY. PLEASE SCHEDULE AN OFFICE VISIT BEFORE ANYMORE REFILLS.   Coenzyme Q10 (CO Q 10 PO) Take by mouth.   fluticasone (FLONASE) 50 MCG/ACT nasal spray SPRAY 2 SPRAYS INTO EACH NOSTRIL EVERY DAY   hydrochlorothiazide (HYDRODIURIL) 25 MG tablet TAKE 1 TABLET (25 MG TOTAL) BY MOUTH DAILY. PLEASE SCHEDULE AN OFFICE VISIT BEFORE ANYMORE REFILLS.   KRILL OIL PO Take by mouth.   lisinopril (ZESTRIL) 30 MG tablet TAKE  1 TABLET (30 MG TOTAL) BY MOUTH DAILY. PLEASE SCHEDULE AN OFFICE VISIT BEFORE ANYMORE REFILLS.   No facility-administered medications prior to visit.    Review of Systems  Constitutional: Negative.   HENT: Negative.    Respiratory: Negative.    Cardiovascular: Negative.   Genitourinary: Negative.   Musculoskeletal: Negative.        Objective  -------------------------------------------------------------------------------------------------------------------- BP (!) 164/89 (BP Location: Right Arm, Patient Position: Sitting, Cuff Size: Large)   Pulse 85   Temp 99 F (37.2 C) (Oral)   Wt (!) 303 lb (137.4 kg)   SpO2 100%   BMI 43.48 kg/m     Physical Exam Constitutional:      General: He is not in acute distress.    Appearance: He is well-developed. He is obese.  HENT:     Head: Normocephalic and atraumatic.     Right Ear: Hearing normal.     Left Ear: Hearing normal.     Nose: Nose normal.  Eyes:     General: Lids are normal. No scleral icterus.       Right eye: No discharge.        Left eye: No discharge.     Conjunctiva/sclera: Conjunctivae normal.  Cardiovascular:     Rate and Rhythm: Normal rate and regular rhythm.     Heart sounds: Normal heart sounds.  Pulmonary:     Effort: Pulmonary effort is normal. No respiratory distress.     Breath sounds: Normal breath sounds.  Abdominal:     General: Bowel sounds are normal.     Palpations: Abdomen is soft.  Musculoskeletal:        General: Normal range of motion.  Skin:    Findings: No lesion or rash.     Comments: Tan pigmentation of the right anterior lower leg.  Neurological:     Mental Status: He is alert and oriented to person, place, and time.  Psychiatric:        Speech: Speech normal.        Behavior: Behavior normal.        Thought Content: Thought content normal.      No results found for any visits on 11/17/20.  Assessment & Plan   ---------------------------------------------------------------------------------------------------------------------- 1. Essential (primary) hypertension BP high today. Has been lost to follow up the past 18 months. Weight up 14 lbs. Now back on Lisinopril 30 mg qd with HCTZ 25 mg qd. Recommend he get back on weight loss low fat diet and restrict caffeine, salt and any ETOH consumptions. Recheck labs. - CBC with Differential/Platelet - Comprehensive metabolic panel - Lipid Panel With LDL/HDL Ratio - TSH  2. Mixed hyperlipidemia Tolerated the Lipitor 20 mg qd without side effects. Must get back on diet and weight loss plan. Recheck labs. Follow up pending reports. - Comprehensive metabolic panel - Lipid Panel With LDL/HDL Ratio - TSH  3. Venous insufficiency Some swelling noted by the end of the day and gone overnight. Recommend compression support  hose.    No follow-ups on file.      I, Tranesha Lessner, PA-C, have reviewed all documentation for this visit. The documentation on 11/17/20 for the exam, diagnosis, procedures, and orders are all accurate and complete.    Dortha Kern, PA-C  [facility] [phone] (phone) [phone] (fax)  [facility]

## 2020-11-21 DIAGNOSIS — I10 Essential (primary) hypertension: Secondary | ICD-10-CM | POA: Diagnosis not present

## 2020-11-21 DIAGNOSIS — E782 Mixed hyperlipidemia: Secondary | ICD-10-CM | POA: Diagnosis not present

## 2020-11-22 LAB — CBC WITH DIFFERENTIAL/PLATELET
Basophils Absolute: 0 10*3/uL (ref 0.0–0.2)
Basos: 0 %
EOS (ABSOLUTE): 0.1 10*3/uL (ref 0.0–0.4)
Eos: 1 %
Hematocrit: 40.1 % (ref 37.5–51.0)
Hemoglobin: 13.3 g/dL (ref 13.0–17.7)
Immature Grans (Abs): 0 10*3/uL (ref 0.0–0.1)
Immature Granulocytes: 0 %
Lymphocytes Absolute: 1.3 10*3/uL (ref 0.7–3.1)
Lymphs: 22 %
MCH: 27.9 pg (ref 26.6–33.0)
MCHC: 33.2 g/dL (ref 31.5–35.7)
MCV: 84 fL (ref 79–97)
Monocytes Absolute: 0.4 10*3/uL (ref 0.1–0.9)
Monocytes: 7 %
Neutrophils Absolute: 4.2 10*3/uL (ref 1.4–7.0)
Neutrophils: 70 %
Platelets: 207 10*3/uL (ref 150–450)
RBC: 4.77 x10E6/uL (ref 4.14–5.80)
RDW: 12.8 % (ref 11.6–15.4)
WBC: 6 10*3/uL (ref 3.4–10.8)

## 2020-11-22 LAB — COMPREHENSIVE METABOLIC PANEL
ALT: 20 IU/L (ref 0–44)
AST: 16 IU/L (ref 0–40)
Albumin/Globulin Ratio: 1.4 (ref 1.2–2.2)
Albumin: 4.2 g/dL (ref 3.8–4.9)
Alkaline Phosphatase: 74 IU/L (ref 44–121)
BUN/Creatinine Ratio: 18 (ref 9–20)
BUN: 18 mg/dL (ref 6–24)
Bilirubin Total: 0.5 mg/dL (ref 0.0–1.2)
CO2: 27 mmol/L (ref 20–29)
Calcium: 9.1 mg/dL (ref 8.7–10.2)
Chloride: 100 mmol/L (ref 96–106)
Creatinine, Ser: 1.02 mg/dL (ref 0.76–1.27)
Globulin, Total: 2.9 g/dL (ref 1.5–4.5)
Glucose: 114 mg/dL — ABNORMAL HIGH (ref 65–99)
Potassium: 4.6 mmol/L (ref 3.5–5.2)
Sodium: 141 mmol/L (ref 134–144)
Total Protein: 7.1 g/dL (ref 6.0–8.5)
eGFR: 88 mL/min/{1.73_m2} (ref >59)

## 2020-11-22 LAB — LIPID PANEL WITH LDL/HDL RATIO
Cholesterol, Total: 110 mg/dL (ref 100–199)
HDL: 31 mg/dL — ABNORMAL LOW (ref >39)
LDL Chol Calc (NIH): 61 mg/dL (ref 0–99)
LDL/HDL Ratio: 2 ratio (ref 0.0–3.6)
Triglycerides: 90 mg/dL (ref 0–149)
VLDL Cholesterol Cal: 18 mg/dL (ref 5–40)

## 2020-11-22 LAB — TSH: TSH: 1.96 u[IU]/mL (ref 0.450–4.500)

## 2020-11-29 ENCOUNTER — Telehealth: Payer: Self-pay

## 2020-11-29 DIAGNOSIS — I10 Essential (primary) hypertension: Secondary | ICD-10-CM

## 2020-11-29 MED ORDER — ATORVASTATIN CALCIUM 20 MG PO TABS: 20.0000 mg | ORAL_TABLET | Freq: Every day | ORAL | 3 refills | Status: DC

## 2020-11-29 MED ORDER — HYDROCHLOROTHIAZIDE 25 MG PO TABS
25.0000 mg | ORAL_TABLET | Freq: Every day | ORAL | 3 refills | Status: DC
Start: 2020-11-29 — End: 2021-11-28

## 2020-11-29 MED ORDER — LISINOPRIL 30 MG PO TABS: 30.0000 mg | ORAL_TABLET | Freq: Every day | ORAL | 3 refills | Status: DC

## 2020-11-29 NOTE — Telephone Encounter (Signed)
Get back on medications regularly, work on weight loss and reduce salt intake. Monitor BP at home and report numbers in 7-10 days to see if dosage adjustments needed.

## 2020-11-29 NOTE — Telephone Encounter (Signed)
-----   Message from Tamsen Roers, PA-C sent at 11/28/2020  3:37 PM EDT ----- Blood tests essentially normal except minor elevation of glucose and drop in HDL cholesterol. Refill Atorvastatin 20 mg qd #90 & 3RF, HCTZ 25 mg qd #90 & 3RF and Lisinopril 30 mg qd #90 & 3RF. Some weight loss would help everything improve. Schedule follow up appointment with Dr. Sullivan Lone in 6 months to assess progress.

## 2020-12-13 ENCOUNTER — Telehealth: Payer: Self-pay

## 2020-12-13 NOTE — Telephone Encounter (Signed)
Placed in your stack for review. Thanks!

## 2020-12-13 NOTE — Telephone Encounter (Signed)
Patient dropped off BP readings (sent back on green chart on a light yellow piece of paper)  and said Travis Tanner was going to adjust his BP medications after looking at his readings.   Please advise.

## 2020-12-30 ENCOUNTER — Ambulatory Visit: Payer: Self-pay | Admitting: *Deleted

## 2020-12-30 NOTE — Telephone Encounter (Signed)
Patient calling to report he was driving to work this morning and he has dizziness and felt faint. Patient pulled over and then drove himself home. Patient reports symptoms are subsided- but he has never had that happen before. Patient is currently on his way to see his chiropractor. Per disposition- patient should be seen within 3 days- no open appointment- note sent for provider review and appointment- patient has been instructed to keep check on BP- notify office any abnormal reading, hydrate well, go to UC if he has another episode before he can be seen in office.

## 2020-12-30 NOTE — Telephone Encounter (Signed)
Reason for Disposition  [1] MILD dizziness (e.g., walking normally) AND [2] has NOT been evaluated by physician for this  (Exception: dizziness caused by heat exposure, sudden standing, or poor fluid intake)  Answer Assessment - Initial Assessment Questions 1. DESCRIPTION: "Describe your dizziness."     Started feeling lightheaded and dizzy, left ear "ringing" 2. LIGHTHEADED: "Do you feel lightheaded?" (e.g., somewhat faint, woozy, weak upon standing)     Dry mouth- drinking fluids now, lightheadedness almost gone 3. VERTIGO: "Do you feel like either you or the room is spinning or tilting?" (i.e. vertigo)     When driving yes 4. SEVERITY: "How bad is it?"  "Do you feel like you are going to faint?" "Can you stand and walk?"   - MILD: Feels slightly dizzy, but walking normally.   - MODERATE: Feels unsteady when walking, but not falling; interferes with normal activities (e.g., school, work).   - SEVERE: Unable to walk without falling, or requires assistance to walk without falling; feels like passing out now.      mild 5. ONSET:  "When did the dizziness begin?"     This morning 6. AGGRAVATING FACTORS: "Does anything make it worse?" (e.g., standing, change in head position)     Patient was driving 7. HEART RATE: "Can you tell me your heart rate?" "How many beats in 15 seconds?"  (Note: not all patients can do this)       no 8. CAUSE: "What do you think is causing the dizziness?"     unsure 9. RECURRENT SYMPTOM: "Have you had dizziness before?" If Yes, ask: "When was the last time?" "What happened that time?"     no 10. OTHER SYMPTOMS: "Do you have any other symptoms?" (e.g., fever, chest pain, vomiting, diarrhea, bleeding)       no 11. PREGNANCY: "Is there any chance you are pregnant?" "When was your last menstrual period?"       N/a  Protocols used: Dizziness - Lightheadedness-A-AH

## 2020-12-30 NOTE — Telephone Encounter (Signed)
Spoke with patient on the phone on the phone who reports that he drove to work around 7am this morning. And states that while driving he began feeling light headed and dizzy. Paitent states that he turned around and when driving home he became very thirsty he states. Patient went home and sat in kitchen ate a small meal and states that afterwards felt 70% better. Patient denied feeling dizzy, off balance, nausea, vomiting or blurred vision. Patient encouraged to monitor his symptoms over the weekend and to keep a log of his blood pressure readings. Patient is requesting appt for evaluation next week, I advised him that there are no available openings and state that I will speak with provider to see if he can be double booked on schedule next week and will return call back to him Monday. Patient advised that if symptoms return and or worse to seek treatment at nearest UC. KW

## 2021-06-01 ENCOUNTER — Ambulatory Visit: Payer: BC Managed Care – PPO | Admitting: Family Medicine

## 2021-06-06 ENCOUNTER — Ambulatory Visit: Payer: BC Managed Care – PPO | Admitting: Family Medicine

## 2021-06-06 NOTE — Progress Notes (Deleted)
?  ? ? ?Established patient visit ? ? ?Patient: Travis D Durney Jr.   DOB: [birth year]   56 y.o. Male  MRN: [id number] ?Visit Date: 06/06/2021 ? ?Today's healthcare provider: Lelon Huh, MD  ? ?No chief complaint on file. ? ?Subjective  ?  ?HPI  ?Hypertension, follow-up ? ?BP Readings from Last 3 Encounters:  ?11/17/20 (!) 164/89  ?06/05/19 (!) 148/84  ?02/03/19 138/70  ? Wt Readings from Last 3 Encounters:  ?11/17/20 (!) 303 lb (137.4 kg)  ?06/05/19 294 lb 9.6 oz (133.6 kg)  ?02/03/19 289 lb (131.1 kg)  ?  ? ?He was last seen for hypertension 6 months ago.  ?BP at that visit was 164/89. Management since that visit includes advising patient to work on weight loss and improving diet. ? ?He reports {excellent/good/fair/poor:19665} compliance with treatment. ?He {is/is not:9024} having side effects. {document side effects if present:1} ?He is following a {diet:21022986} diet. ?He {is/is not:9024} exercising. ?He {does/does not:200015} smoke. ? ?Use of agents associated with hypertension: none.  ? ?Outside blood pressures are {***enter patient reported home BP readings, or 'not being checked':1}. ?Symptoms: ?{Yes/No:20286} chest pain {Yes/No:20286} chest pressure  ?{Yes/No:20286} palpitations {Yes/No:20286} syncope  ?{Yes/No:20286} dyspnea {Yes/No:20286} orthopnea  ?{Yes/No:20286} paroxysmal nocturnal dyspnea {Yes/No:20286} lower extremity edema  ? ?Pertinent labs: ?Lab Results  ?Component Value Date  ? CHOL 110 11/21/2020  ? HDL 31 (L) 11/21/2020  ? [place] 61 11/21/2020  ? TRIG 90 11/21/2020  ? CHOLHDL 4.1 06/05/2019  ? Lab Results  ?Component Value Date  ? NA 141 11/21/2020  ? K 4.6 11/21/2020  ? CREATININE 1.02 11/21/2020  ? EGFR 88 11/21/2020  ? GLUCOSE 114 (H) 11/21/2020  ? TSH 1.960 11/21/2020  ?  ? ?The ASCVD Risk score (Arnett DK, et al., 2019) failed to calculate for the following reasons: ?  The valid total cholesterol range is 130 to 320 mg/dL   ? ?---------------------------------------------------------------------------------------------------  ? ?Lipid/Cholesterol, Follow-up ? ?Last lipid panel Other pertinent labs  ?Lab Results  ?Component Value Date  ? CHOL 110 11/21/2020  ? HDL 31 (L) 11/21/2020  ? [place] 61 11/21/2020  ? TRIG 90 11/21/2020  ? CHOLHDL 4.1 06/05/2019  ? Lab Results  ?Component Value Date  ? ALT 20 11/21/2020  ? AST 16 11/21/2020  ? PLT 207 11/21/2020  ? TSH 1.960 11/21/2020  ?  ? ?He was last seen for this 6 months ago.  ?Management since that visit includes continue same medication. ? ?He reports {excellent/good/fair/poor:19665} compliance with treatment. ?He {is/is not:9024} having side effects. {document side effects if present:1} ? ?Symptoms: ?{Yes/No:20286} chest pain {Yes/No:20286} chest pressure/discomfort  ?{Yes/No:20286} dyspnea {Yes/No:20286} lower extremity edema  ?{Yes/No:20286} numbness or tingling of extremity {Yes/No:20286} orthopnea  ?{Yes/No:20286} palpitations {Yes/No:20286} paroxysmal nocturnal dyspnea  ?{Yes/No:20286} speech difficulty {Yes/No:20286} syncope  ? ?Current diet: {diet habits:16563} ?Current exercise: {exercise types:16438} ? ?The ASCVD Risk score (Arnett DK, et al., 2019) failed to calculate for the following reasons: ?  The valid total cholesterol range is 130 to 320 mg/dL ? ?---------------------------------------------------------------------------------------------------  ? ?Medications: ?Outpatient Medications Prior to Visit  ?Medication Sig  ? atorvastatin (LIPITOR) 20 MG tablet Take 1 tablet (20 mg total) by mouth daily.  ? Coenzyme Q10 (CO Q 10 PO) Take by mouth.  ? fluticasone (FLONASE) 50 MCG/ACT nasal spray SPRAY 2 SPRAYS INTO EACH NOSTRIL EVERY DAY  ? hydrochlorothiazide (HYDRODIURIL) 25 MG tablet Take 1 tablet (25 mg total) by mouth daily. Please schedule an office visit before anymore refills.  ? KRILL OIL PO  Take by mouth.  ? lisinopril (ZESTRIL) 30 MG tablet Take 1 tablet (30 mg  total) by mouth daily. Please schedule an office visit before anymore refills.  ? ?No facility-administered medications prior to visit.  ? ? ?Review of Systems ? ?{Labs  Heme  Chem  Endocrine  Serology  Results Review (optional):23779} ?  Objective  ?  ?There were no vitals taken for this visit. ?{Show previous vital signs (optional):23777} ? ?Physical Exam  ?*** ? ?No results found for any visits on 06/06/21. ? Assessment & Plan  ?  ? ?*** ? ?No follow-ups on file.  ?   ? ?{provider attestation***:1} ? ? ?Lelon Huh, MD  ?[facility] ?[phone] (phone) ?[phone] (fax) ? ?[facility]  ?

## 2021-11-28 ENCOUNTER — Telehealth: Payer: Self-pay | Admitting: Family Medicine

## 2021-11-28 ENCOUNTER — Other Ambulatory Visit: Payer: Self-pay

## 2021-11-28 DIAGNOSIS — I10 Essential (primary) hypertension: Secondary | ICD-10-CM

## 2021-11-28 MED ORDER — ATORVASTATIN CALCIUM 20 MG PO TABS: 20.0000 mg | ORAL_TABLET | Freq: Every day | ORAL | 0 refills | Status: DC

## 2021-11-28 MED ORDER — LISINOPRIL 30 MG PO TABS: 30.0000 mg | ORAL_TABLET | Freq: Every day | ORAL | 0 refills | Status: DC

## 2021-11-28 MED ORDER — HYDROCHLOROTHIAZIDE 25 MG PO TABS: 25.0000 mg | ORAL_TABLET | Freq: Every day | ORAL | 0 refills | Status: DC

## 2021-11-28 NOTE — Telephone Encounter (Signed)
[facility] faxed refill request for the following medications:  hydrochlorothiazide (HYDRODIURIL) 25 MG tablet   lisinopril (ZESTRIL) 30 MG tablet  Please advise

## 2021-11-28 NOTE — Telephone Encounter (Signed)
[facility] faxed refill request for the following medications:   atorvastatin (LIPITOR) 20 MG tablet   Please advise

## 2021-12-20 ENCOUNTER — Other Ambulatory Visit: Payer: Self-pay | Admitting: Family Medicine

## 2021-12-20 DIAGNOSIS — I10 Essential (primary) hypertension: Secondary | ICD-10-CM

## 2021-12-28 ENCOUNTER — Encounter: Payer: Self-pay | Admitting: Physician Assistant

## 2021-12-28 ENCOUNTER — Other Ambulatory Visit: Payer: Self-pay | Admitting: Family Medicine

## 2021-12-28 ENCOUNTER — Ambulatory Visit: Payer: BC Managed Care – PPO | Admitting: Physician Assistant

## 2021-12-28 VITALS — BP 158/78 | HR 102 | Ht 70.5 in | Wt 294.2 lb

## 2021-12-28 DIAGNOSIS — H9312 Tinnitus, left ear: Secondary | ICD-10-CM | POA: Insufficient documentation

## 2021-12-28 DIAGNOSIS — R42 Dizziness and giddiness: Secondary | ICD-10-CM

## 2021-12-28 DIAGNOSIS — Z1159 Encounter for screening for other viral diseases: Secondary | ICD-10-CM

## 2021-12-28 DIAGNOSIS — R739 Hyperglycemia, unspecified: Secondary | ICD-10-CM | POA: Diagnosis not present

## 2021-12-28 DIAGNOSIS — R0981 Nasal congestion: Secondary | ICD-10-CM

## 2021-12-28 DIAGNOSIS — I10 Essential (primary) hypertension: Secondary | ICD-10-CM

## 2021-12-28 DIAGNOSIS — E782 Mixed hyperlipidemia: Secondary | ICD-10-CM | POA: Diagnosis not present

## 2021-12-28 DIAGNOSIS — Z114 Encounter for screening for human immunodeficiency virus [HIV]: Secondary | ICD-10-CM

## 2021-12-28 DIAGNOSIS — Z1211 Encounter for screening for malignant neoplasm of colon: Secondary | ICD-10-CM

## 2021-12-28 MED ORDER — ATORVASTATIN CALCIUM 20 MG PO TABS: 20.0000 mg | ORAL_TABLET | Freq: Every day | ORAL | 1 refills | Status: DC

## 2021-12-28 MED ORDER — HYDROCHLOROTHIAZIDE 25 MG PO TABS: 25.0000 mg | ORAL_TABLET | Freq: Every day | ORAL | 1 refills | Status: DC

## 2021-12-28 NOTE — Progress Notes (Signed)
I,Travis Tanner,acting as a Education administrator for [employer], PA-C.,have documented all relevant documentation on the behalf of [name], PA-C,as directed by  [name], PA-C while in the presence of [name], PA-C.  Established patient visit   Patient: Travis D Thorington Jr.   DOB: September 02, 1965   56 y.o. Male  MRN: [id number] Visit Date: 12/28/2021  Today's healthcare provider: [name], PA-C   Cc. Htn f/u, dizziness, tinnitus  Subjective    HPI   Pt reports intermittent tinnitus in his left ear for at least the last year. Reports it sounds loud and high pitched and his hearing is diminished during that time. The sensation will last a few seconds and resolve. Has tried flonase in the past without improvement.  Pt does report nasal congestion and often blows his nose and sees green nasal mucous. Denies headaches, sinus pressure, ear pain  Pt also reports intermittent episodes of dizziness that he describes as a wooziness or uneasy feeling. States he feels it creep up the back of his head. Not linked to timing of meals, hydration, activity. Sometimes occurs in tandem with the tinnitus.  Denies headaches, changes in vision, syncope.   Pt reports he has an active job, and eats a low salt diet.  Hypertension, follow-up  BP Readings from Last 3 Encounters:  12/28/21 (!) 158/78  11/17/20 (!) 164/89  06/05/19 (!) 148/84   Wt Readings from Last 3 Encounters:  12/28/21 294 lb 3.2 oz (133.4 kg)  11/17/20 (!) 303 lb (137.4 kg)  06/05/19 294 lb 9.6 oz (133.6 kg)     He was last seen for hypertension 14 months ago.  BP at that visit was 164/89. Management since that visit includes Recommend he get back on weight loss low fat diet and restrict caffeine, salt and any ETOH consumptions and continue current medications.  He reports excellent compliance with treatment. He is not having side effects.  He is following a Regular, Low Sodium diet. He is exercising. He does not  smoke.  Use of agents associated with hypertension: none.   Outside blood pressures are checked on occassions Symptoms: No chest pain No chest pressure  No palpitations No syncope  No dyspnea No orthopnea  No paroxysmal nocturnal dyspnea No lower extremity edema   Pertinent labs Lab Results  Component Value Date   CHOL 110 11/21/2020   HDL 31 (L) 11/21/2020   LDLCALC 61 11/21/2020   TRIG 90 11/21/2020   CHOLHDL 4.1 06/05/2019   Lab Results  Component Value Date   NA 141 11/21/2020   K 4.6 11/21/2020   CREATININE 1.02 11/21/2020   EGFR 88 11/21/2020   GLUCOSE 114 (H) 11/21/2020   TSH 1.960 11/21/2020     The ASCVD Risk score (Arnett DK, et al., 2019) failed to calculate for the following reasons:   The valid total cholesterol range is 130 to 320 mg/dL  ---------------------------------------------------------------------------------------------------  Lipid/Cholesterol, Follow-up  Last lipid panel Other pertinent labs  Lab Results  Component Value Date   CHOL 110 11/21/2020   HDL 31 (L) 11/21/2020   LDLCALC 61 11/21/2020   TRIG 90 11/21/2020   CHOLHDL 4.1 06/05/2019   Lab Results  Component Value Date   ALT 20 11/21/2020   AST 16 11/21/2020   PLT 207 11/21/2020   TSH 1.960 11/21/2020     He was last seen for this 14  months ago.  Management since that visit includes must get back on diet and weight loss  plan and continue current medications.  He reports excellent compliance with treatment. He is not having side effects.   Symptoms: No chest pain No chest pressure/discomfort  No dyspnea No lower extremity edema  No numbness or tingling of extremity Yes orthopnea  No palpitations No paroxysmal nocturnal dyspnea  No speech difficulty No syncope   Current diet: well balanced, low salt Current exercise: walking  The ASCVD Risk score (Arnett DK, et al., 2019) failed to calculate for the following reasons:   The valid total cholesterol range is 130 to 320  mg/dL  ---------------------------------------------------------------------------------------------------   Medications: Outpatient Medications Prior to Visit  Medication Sig   Coenzyme Q10 (CO Q 10 PO) Take by mouth.   KRILL OIL PO Take by mouth.   [DISCONTINUED] atorvastatin (LIPITOR) 20 MG tablet Take 1 tablet (20 mg total) by mouth daily.   [DISCONTINUED] hydrochlorothiazide (HYDRODIURIL) 25 MG tablet Take 1 tablet (25 mg total) by mouth daily. Please schedule an office visit before anymore refills.   [DISCONTINUED] lisinopril (ZESTRIL) 30 MG tablet Take 1 tablet (30 mg total) by mouth daily. Please schedule an office visit before anymore refills.   [DISCONTINUED] fluticasone (FLONASE) 50 MCG/ACT nasal spray SPRAY 2 SPRAYS INTO EACH NOSTRIL EVERY DAY (Patient not taking: Reported on 12/28/2021)   No facility-administered medications prior to visit.    Review of Systems  Constitutional:  Negative for fatigue and fever.  HENT:  Positive for congestion, rhinorrhea and tinnitus.   Respiratory:  Negative for cough and shortness of breath.   Cardiovascular:  Negative for chest pain, palpitations and leg swelling.  Neurological:  Positive for dizziness and light-headedness. Negative for headaches.       Objective    Blood pressure (!) 158/78, pulse (!) 102, height 5' 10.5" (1.791 m), weight 294 lb 3.2 oz (133.4 kg).   Physical Exam Constitutional:      General: He is awake.     Appearance: He is well-developed.  HENT:     Head: Normocephalic.  Eyes:     Conjunctiva/sclera: Conjunctivae normal.  Neck:     Vascular: No carotid bruit.  Cardiovascular:     Rate and Rhythm: Normal rate and regular rhythm.     Heart sounds: Normal heart sounds.  Pulmonary:     Effort: Pulmonary effort is normal.     Breath sounds: Normal breath sounds.  Skin:    General: Skin is warm.  Neurological:     Mental Status: He is alert and oriented to person, place, and time.  Psychiatric:         Attention and Perception: Attention normal.        Mood and Affect: Mood normal.        Speech: Speech normal.        Behavior: Behavior is cooperative.      No results found for any visits on 12/28/21.  Assessment & Plan     Problem List Items Addressed This Visit       Cardiovascular and Mediastinum   Essential (primary) hypertension - Primary    Uncontrolled, chronic Advised increase lisinopril to 40 mg and keep hctz 25 mg Check bp at home, 2-3 x a week and when feeling unwell Ordered cmp F/u 4 weeks      Relevant Medications   hydrochlorothiazide (HYDRODIURIL) 25 MG tablet   atorvastatin (LIPITOR) 20 MG tablet   Other Relevant Orders   Comprehensive Metabolic Panel (CMET)     Other   Hyperlipidemia    Historically  well controlled w/ atorvastatin 20 mg Repeat fasting lipids today       Relevant Medications   hydrochlorothiazide (HYDRODIURIL) 25 MG tablet   atorvastatin (LIPITOR) 20 MG tablet   Other Relevant Orders   Lipid Profile   Nasal congestion    Advised saline nasal rinses x2 daily. If no improvement at f/u and mucous still green consider course of abx, chronic sinusitis      Tinnitus of left ear    See dizziness above May be related to nasal congestion, see nasal congestion      Relevant Orders   Ambulatory referral to Vascular Surgery   Dizziness    Symptoms of dizziness, tinnitus w/ hearing loss and HLD -- concern for vascular abnormality? Advised to check bp in times of dizziness.  Ref to vascular for eval. -- if no findings, consider holter monitor vs neuro referral      Relevant Orders   Ambulatory referral to Vascular Surgery   Other Visit Diagnoses     Encounter for screening for HIV       Relevant Orders   HIV antibody (with reflex)   Hyperglycemia       Relevant Orders   HgB A1c   Colon cancer screening       Relevant Orders   Cologuard   Encounter for hepatitis C screening test for low risk patient       Relevant Orders    Hepatitis C antibody       Return in about 4 weeks (around 01/25/2022) for hypertension.      I, [name], PA-C have reviewed all documentation for this visit. The documentation on  12/28/2021 for the exam, diagnosis, procedures, and orders are all accurate and complete.  [name], PA-C [facility] [address] [place], [place], [address] Office: [phone] Fax: [place]

## 2021-12-28 NOTE — Assessment & Plan Note (Signed)
Historically well controlled w/ atorvastatin 20 mg Repeat fasting lipids today

## 2021-12-28 NOTE — Assessment & Plan Note (Signed)
See dizziness above May be related to nasal congestion, see nasal congestion

## 2021-12-28 NOTE — Assessment & Plan Note (Addendum)
Uncontrolled, chronic Advised increase lisinopril to 40 mg and keep hctz 25 mg Check bp at home, 2-3 x a week and when feeling unwell Ordered cmp F/u 4 weeks

## 2021-12-28 NOTE — Assessment & Plan Note (Signed)
Advised saline nasal rinses x2 daily. If no improvement at f/u and mucous still green consider course of abx, chronic sinusitis

## 2021-12-28 NOTE — Assessment & Plan Note (Addendum)
Symptoms of dizziness, tinnitus w/ hearing loss and HLD -- concern for vascular abnormality? Advised to check bp in times of dizziness.  Ref to vascular for eval. -- if no findings, consider holter monitor vs neuro referral

## 2021-12-29 ENCOUNTER — Other Ambulatory Visit: Payer: Self-pay | Admitting: Physician Assistant

## 2021-12-29 DIAGNOSIS — R739 Hyperglycemia, unspecified: Secondary | ICD-10-CM | POA: Diagnosis not present

## 2021-12-29 DIAGNOSIS — I10 Essential (primary) hypertension: Secondary | ICD-10-CM

## 2021-12-29 DIAGNOSIS — E782 Mixed hyperlipidemia: Secondary | ICD-10-CM | POA: Diagnosis not present

## 2021-12-29 DIAGNOSIS — Z1159 Encounter for screening for other viral diseases: Secondary | ICD-10-CM | POA: Diagnosis not present

## 2021-12-29 DIAGNOSIS — Z114 Encounter for screening for human immunodeficiency virus [HIV]: Secondary | ICD-10-CM | POA: Diagnosis not present

## 2021-12-29 MED ORDER — LISINOPRIL 40 MG PO TABS: 40.0000 mg | ORAL_TABLET | Freq: Every day | ORAL | 3 refills | Status: DC

## 2021-12-30 LAB — LIPID PANEL
Chol/HDL Ratio: 3.3 ratio (ref 0.0–5.0)
Cholesterol, Total: 115 mg/dL (ref 100–199)
HDL: 35 mg/dL — ABNORMAL LOW (ref >39)
LDL Chol Calc (NIH): 64 mg/dL (ref 0–99)
Triglycerides: 77 mg/dL (ref 0–149)
VLDL Cholesterol Cal: 16 mg/dL (ref 5–40)

## 2021-12-30 LAB — COMPREHENSIVE METABOLIC PANEL
ALT: 21 IU/L (ref 0–44)
AST: 17 IU/L (ref 0–40)
Albumin/Globulin Ratio: 1.3 (ref 1.2–2.2)
Albumin: 4.1 g/dL (ref 3.8–4.9)
Alkaline Phosphatase: 67 IU/L (ref 44–121)
BUN/Creatinine Ratio: 15 (ref 9–20)
BUN: 16 mg/dL (ref 6–24)
Bilirubin Total: 0.6 mg/dL (ref 0.0–1.2)
CO2: 27 mmol/L (ref 20–29)
Calcium: 9.3 mg/dL (ref 8.7–10.2)
Chloride: 97 mmol/L (ref 96–106)
Creatinine, Ser: 1.09 mg/dL (ref 0.76–1.27)
Globulin, Total: 3.1 g/dL (ref 1.5–4.5)
Glucose: 104 mg/dL — ABNORMAL HIGH (ref 70–99)
Potassium: 3.9 mmol/L (ref 3.5–5.2)
Sodium: 138 mmol/L (ref 134–144)
Total Protein: 7.2 g/dL (ref 6.0–8.5)
eGFR: 81 mL/min/{1.73_m2} (ref >59)

## 2021-12-30 LAB — HEMOGLOBIN A1C
Est. average glucose Bld gHb Est-mCnc: 114 mg/dL
Hgb A1c MFr Bld: 5.6 % (ref 4.8–5.6)

## 2021-12-30 LAB — HIV ANTIBODY (ROUTINE TESTING W REFLEX): HIV Screen 4th Generation wRfx: NONREACTIVE

## 2021-12-30 LAB — HEPATITIS C ANTIBODY: Hep C Virus Ab: NONREACTIVE

## 2022-01-26 ENCOUNTER — Encounter: Payer: Self-pay | Admitting: Physician Assistant

## 2022-01-26 ENCOUNTER — Ambulatory Visit: Payer: BC Managed Care – PPO | Admitting: Physician Assistant

## 2022-01-26 VITALS — BP 153/77 | HR 84 | Ht 70.5 in | Wt 292.7 lb

## 2022-01-26 DIAGNOSIS — I10 Essential (primary) hypertension: Secondary | ICD-10-CM | POA: Diagnosis not present

## 2022-01-26 NOTE — Assessment & Plan Note (Addendum)
Chronic, uncontrolled. Managed on lisinopril 40 and hctz 25 mg Advised lifestyle changes-- dash diet, increase exercise or would need to add another medication F/u 6 weeks

## 2022-01-26 NOTE — Progress Notes (Signed)
I,[name],acting as a Education administrator for [employer], PA-C.,have documented all relevant documentation on the behalf of Travis Kirschner, PA-C,as directed by  Travis Kirschner, PA-C while in the presence of Travis Kirschner, PA-C.   Established patient visit   Patient: Travis D [place] Jr.   DOB: March 12, 1966   56 y.o. Male  MRN: [id number] Visit Date: 01/26/2022  Today's healthcare provider: [name], PA-C   Cc. Htn f/u  Subjective    HPI  Hypertension, follow-up  BP Readings from Last 3 Encounters:  01/26/22 (!) 153/77  12/28/21 (!) 158/78  11/17/20 (!) 164/89   Wt Readings from Last 3 Encounters:  01/26/22 292 lb 11.2 oz (132.8 kg)  12/28/21 294 lb 3.2 oz (133.4 kg)  11/17/20 (!) 303 lb (137.4 kg)     He was last seen for hypertension 1 months ago.  Management since that visit includes; Advised increase lisinopril to 40 mg and keep hctz 25 mg. Advised to check bp at home, 2-3 x a week and when feeling unwell. He reports excellent compliance with treatment. He is not having side effects.  He is following a Low Sodium diet. He is exercising. He does not smoke.  Use of agents associated with hypertension: none.   Outside blood pressures are 152/70  Symptoms: No chest pain No chest pressure  No palpitations No syncope  No dyspnea No orthopnea  No paroxysmal nocturnal dyspnea No lower extremity edema   Pertinent labs Lab Results  Component Value Date   CHOL 115 12/29/2021   HDL 35 (L) 12/29/2021   LDLCALC 64 12/29/2021   TRIG 77 12/29/2021   CHOLHDL 3.3 12/29/2021   Lab Results  Component Value Date   NA 138 12/29/2021   K 3.9 12/29/2021   CREATININE 1.09 12/29/2021   EGFR 81 12/29/2021   GLUCOSE 104 (H) 12/29/2021   TSH 1.960 11/21/2020     The ASCVD Risk score (Arnett DK, et al., 2019) failed to calculate for the following reasons:   The valid total cholesterol range is 130 to 320  mg/dL  ---------------------------------------------------------------------------------------------------   Medications: Outpatient Medications Prior to Visit  Medication Sig   atorvastatin (LIPITOR) 20 MG tablet Take 1 tablet (20 mg total) by mouth daily.   Coenzyme Q10 (CO Q 10 PO) Take by mouth.   hydrochlorothiazide (HYDRODIURIL) 25 MG tablet Take 1 tablet (25 mg total) by mouth daily. Please schedule an office visit before anymore refills.   KRILL OIL PO Take by mouth.   lisinopril (ZESTRIL) 40 MG tablet Take 1 tablet (40 mg total) by mouth daily.   No facility-administered medications prior to visit.    Review of Systems  Constitutional:  Negative for appetite change, chills and fever.  Respiratory:  Negative for chest tightness, shortness of breath and wheezing.   Cardiovascular:  Negative for chest pain and palpitations.  Gastrointestinal:  Negative for abdominal pain, nausea and vomiting.     Objective    Blood pressure (!) 153/77, pulse 84, height 5' 10.5" (1.791 m), weight 292 lb 11.2 oz (132.8 kg), SpO2 100 %.   Physical Exam Constitutional:      General: He is awake.     Appearance: He is well-developed.  HENT:     Head: Normocephalic.  Eyes:     Conjunctiva/sclera: Conjunctivae normal.  Cardiovascular:     Rate and Rhythm: Normal rate and regular rhythm.     Heart sounds: Normal heart sounds.  Pulmonary:     Effort: Pulmonary effort  is normal.     Breath sounds: Normal breath sounds.  Skin:    General: Skin is warm.  Neurological:     Mental Status: He is alert and oriented to person, place, and time.  Psychiatric:        Attention and Perception: Attention normal.        Mood and Affect: Mood normal.        Speech: Speech normal.        Behavior: Behavior is cooperative.      No results found for any visits on 01/26/22.  Assessment & Plan     Problem List Items Addressed This Visit       Cardiovascular and Mediastinum   Essential (primary)  hypertension - Primary    Chronic, uncontrolled. Managed on lisinopril 40 and hctz 25 mg Advised lifestyle changes-- dash diet, increase exercise or would need to add another medication F/u 6 weeks        Return in about 6 weeks (around 03/09/2022) for hypertension.      I, Travis Kirschner, PA-C have reviewed all documentation for this visit. The documentation on  01/26/2022 for the exam, diagnosis, procedures, and orders are all accurate and complete.  Travis Kirschner, PA-C [facility] [address] [place], [place], [address] Office: [phone] Fax: [place]

## 2022-03-08 ENCOUNTER — Other Ambulatory Visit (INDEPENDENT_AMBULATORY_CARE_PROVIDER_SITE_OTHER): Payer: Self-pay | Admitting: Vascular Surgery

## 2022-03-08 DIAGNOSIS — H9311 Tinnitus, right ear: Secondary | ICD-10-CM

## 2022-03-08 DIAGNOSIS — R42 Dizziness and giddiness: Secondary | ICD-10-CM

## 2022-03-11 NOTE — Progress Notes (Unsigned)
MRN : [id number]  Travis Tanner. is a 56 y.o. (1965-12-26) male who presents with chief complaint of check carotid arteries.  History of Present Illness:   The patient is seen for evaluation of a syncopal episode. The patient describes it as a light headedness or dizziness seemed to be associated with a ringing in the ear and he denies the "room spinning".  He noted that sometimes it feels like it begins at the base of his neck and the back and seems to start to move up almost like a prodrome.  Episodes lasted on the order of minutes and resolved completely.  There was no loss of consciousness.  There have been two or three episodes but he has not had this recur for about 4 to 6 months.  He has experienced a few palpitations in the past but none seem to be directly associated with the spells.  There is no recent history of TIA symptoms or focal motor deficits. There is no prior documented CVA.  The patient was not taking enteric-coated aspirin 81 mg daily at the time.  There is no history of migraine headaches or prior diagnosis of ocular migraine. There is no history of seizures.  No recent shortening of the patient's walking distance or new symptoms consistent with claudication.  No history of rest pain symptoms. No ulcers or wounds of the lower extremities have occurred.  There is no history of DVT, PE or superficial thrombophlebitis. No recent episodes of angina or shortness of breath documented.  Duplex ultrasound of the carotid arteries obtained today demonstrates normal carotid arteries bilaterally there is no evidence of intimal thickening or plaque buildup.  Both vertebral arteries are antegrade in both subclavian arteries demonstrate normal waveforms  No outpatient medications have been marked as taking for the 03/12/22 encounter (Appointment) with Gilda Crease, Latina Craver, MD.    No past medical history on file.  Past Surgical History:  Procedure Laterality Date   NO PAST  SURGERIES      Social History Social History   Tobacco Use   Smoking status: Never   Smokeless tobacco: Never  Vaping Use   Vaping Use: Never used  Substance Use Topics   Alcohol use: Yes    Alcohol/week: 0.0 standard drinks of alcohol    Comment: occasionally- drinks twice a day and drinks 4-6 beers   Drug use: No    Family History Family History  Problem Relation Age of Onset   Cancer Mother    Cancer Maternal Grandmother    Cancer Maternal Grandfather    Cancer Paternal Grandmother     No Known Allergies   REVIEW OF SYSTEMS (Negative unless checked)  Constitutional: [] Weight loss  [] Fever  [] Chills Cardiac: [] Chest pain   [] Chest pressure   [] Palpitations   [] Shortness of breath when laying flat   [] Shortness of breath with exertion. Vascular:  [x] Pain in legs with walking   [] Pain in legs at rest  [] History of DVT   [] Phlebitis   [] Swelling in legs   [] Varicose veins   [] Non-healing ulcers Pulmonary:   [] Uses home oxygen   [] Productive cough   [] Hemoptysis   [] Wheeze  [] COPD   [] Asthma Neurologic:  [] Dizziness   [] Seizures   [] History of stroke   [] History of TIA  [] Aphasia   [] Vissual changes   [] Weakness or numbness in arm   [] Weakness or numbness in leg Musculoskeletal:   [] Joint swelling   [] Joint pain   [] Low back pain  Hematologic:  [] Easy bruising  [] Easy bleeding   [] Hypercoagulable state   [] Anemic Gastrointestinal:  [] Diarrhea   [] Vomiting  [] Gastroesophageal reflux/heartburn   [] Difficulty swallowing. Genitourinary:  [] Chronic kidney disease   [] Difficult urination  [] Frequent urination   [] Blood in urine Skin:  [] Rashes   [] Ulcers  Psychological:  [] History of anxiety   []  History of major depression.  Physical Examination  There were no vitals filed for this visit. There is no height or weight on file to calculate BMI. Gen: WD/WN, NAD Head: [place]/AT, No temporalis wasting.  Ear/Nose/Throat: Hearing grossly intact, nares w/o erythema or drainage Eyes:  PER, EOMI, sclera nonicteric.  Neck: Supple, no masses.  No bruit or JVD.  Pulmonary:  Good air movement, no audible wheezing, no use of accessory muscles.  Cardiac: RRR, normal S1, S2, no Murmurs. Vascular: No carotid bruit noted Vessel Right Left  Radial Palpable Palpable  Carotid  Palpable  Palpable  Subclav  Palpable Palpable  Gastrointestinal: soft, non-distended. No guarding/no peritoneal signs.  Musculoskeletal: M/S 5/5 throughout.  No visible deformity.  Neurologic: CN 2-12 intact. Pain and light touch intact in extremities.  Symmetrical.  Speech is fluent. Motor exam as listed above. Psychiatric: Judgment intact, Mood & affect appropriate for pt's clinical situation. Dermatologic: No rashes or ulcers noted.  No changes consistent with cellulitis.   CBC Lab Results  Component Value Date   WBC 6.0 11/21/2020   HGB 13.3 11/21/2020   HCT 40.1 11/21/2020   MCV 84 11/21/2020   PLT 207 11/21/2020    BMET    Component Value Date/Time   NA 138 12/29/2021 0740   K 3.9 12/29/2021 0740   CL 97 12/29/2021 0740   CO2 27 12/29/2021 0740   GLUCOSE 104 (H) 12/29/2021 0740   BUN 16 12/29/2021 0740   CREATININE 1.09 12/29/2021 0740   CALCIUM 9.3 12/29/2021 0740   GFRNONAA 90 06/05/2019 1124   GFRAA 104 06/05/2019 1124   CrCl cannot be calculated (Patient's most recent lab result is older than the maximum 21 days allowed.).  COAG No results found for: "INR", "PROTIME"  Radiology No results found.   Assessment/Plan 1. Dizziness Recommend:  Given the patient's normal duplex ultrasound of the carotid arteries and antegrade flow in the vertebral arteries no further invasive testing or surgery at this time.  Findings by the duplex ultrasound would not support vertebral basilar syndrome or subclavian steal syndrome.  This would make an earlier pathology perhaps a self-limited viral infection or cardiac arrhythmia the 2 more likely causes.  His symptoms have not occurred now in  perhaps 6 months and therefore I do not feel that urgent referral to cardiology or ENT is indicated however if his symptoms recur this would be the place to start the workup.  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  The patient is told that if symptoms of dizziness or vertigo should occur then he should go to the ER and I should be notified, as this would change the management course.  The patient voices understanding.  2. Mixed hyperlipidemia Continue statin as ordered and reviewed, no changes at this time  3. Essential (primary) hypertension Continue antihypertensive medications as already ordered, these medications have been reviewed and there are no changes at this time.  Patient did mention that his hypertension  has been a bit more difficult to control lately and his meds are getting changed.  I did discuss with him the role that renal artery stenosis can play in renovascular hypertension.  He will check with his primary and follow-up with me if a renal artery duplex is indicated.  4. Dizziness and giddiness See #1 - VAS US CAROTID  5. Tinnitus of right ear See #1 - VAS US CAROTID    Levora Dredge, MD  03/11/2022 3:35 PM

## 2022-03-12 ENCOUNTER — Other Ambulatory Visit: Payer: Self-pay | Admitting: Physician Assistant

## 2022-03-12 ENCOUNTER — Encounter: Payer: Self-pay | Admitting: Physician Assistant

## 2022-03-12 ENCOUNTER — Ambulatory Visit (INDEPENDENT_AMBULATORY_CARE_PROVIDER_SITE_OTHER): Payer: BC Managed Care – PPO | Admitting: Vascular Surgery

## 2022-03-12 ENCOUNTER — Encounter (INDEPENDENT_AMBULATORY_CARE_PROVIDER_SITE_OTHER): Payer: Self-pay | Admitting: Vascular Surgery

## 2022-03-12 ENCOUNTER — Ambulatory Visit: Payer: BC Managed Care – PPO | Admitting: Physician Assistant

## 2022-03-12 ENCOUNTER — Telehealth: Payer: Self-pay | Admitting: Physician Assistant

## 2022-03-12 ENCOUNTER — Ambulatory Visit (INDEPENDENT_AMBULATORY_CARE_PROVIDER_SITE_OTHER): Payer: Self-pay

## 2022-03-12 VITALS — BP 159/77 | HR 88 | Temp 97.5°F | Wt 294.5 lb

## 2022-03-12 VITALS — BP 163/94 | HR 83 | Resp 16 | Wt 295.2 lb

## 2022-03-12 DIAGNOSIS — I10 Essential (primary) hypertension: Secondary | ICD-10-CM

## 2022-03-12 DIAGNOSIS — E782 Mixed hyperlipidemia: Secondary | ICD-10-CM | POA: Diagnosis not present

## 2022-03-12 DIAGNOSIS — H9311 Tinnitus, right ear: Secondary | ICD-10-CM

## 2022-03-12 DIAGNOSIS — R42 Dizziness and giddiness: Secondary | ICD-10-CM

## 2022-03-12 MED ORDER — AMLODIPINE BESYLATE 5 MG PO TABS: 5.0000 mg | ORAL_TABLET | Freq: Every day | ORAL | 1 refills | Status: DC

## 2022-03-12 NOTE — Assessment & Plan Note (Addendum)
Chronic,still uncontrolled. Managed on lisinopril 40, hctz 25 mg. Last visit discussed monitoring at home, diet changes, exercise.  Discussed adding either amlodipine or switching hctz to maxide  Pt will see vascular today Will contact pt with recommendations

## 2022-03-12 NOTE — Patient Instructions (Signed)
Adding either amlodipine  Or switching hctz to maxide

## 2022-03-12 NOTE — Telephone Encounter (Signed)
Patient stopped by after seeing Dr. Gilda Crease. Ultrasound looked good, no issues.   Dr. Gilda Crease recommended you add amlodipine or switching HCTZ to [facility].   Please let patient know which one you want to add/change.

## 2022-03-12 NOTE — Progress Notes (Signed)
I,Connie R Striblin,acting as a Education administrator for [employer], PA-C.,have documented all relevant documentation on the behalf of [name], PA-C,as directed by  [name], PA-C while in the presence of [name], PA-C.   Established patient visit   Patient: Travis D Pask Jr.   DOB: 20-Feb-1966   56 y.o. Male  MRN: [id number] Visit Date: 03/12/2022  Today's healthcare provider: [name], PA-C   Cc. Htn f/u  Subjective    HPI  Hypertension, follow-up  BP Readings from Last 3 Encounters:  03/12/22 (!) 159/77  01/26/22 (!) 153/77  12/28/21 (!) 158/78   Wt Readings from Last 3 Encounters:  03/12/22 294 lb 8 oz (133.6 kg)  01/26/22 292 lb 11.2 oz (132.8 kg)  12/28/21 294 lb 3.2 oz (133.4 kg)     He was last seen for hypertension 1 months ago.  BP at that visit was 153/77 . Management since that visit includes Managed on lisinopril 40 and hctz 25 mg .  He reports excellent compliance with treatment. He is not having side effects.He is following a Low Sodium diet. He is exercising. He does not smoke.  Use of agents associated with hypertension: none.   Outside blood pressures are around 152/75. Symptoms: No chest pain No chest pressure  No palpitations No syncope  No dyspnea No orthopnea  No paroxysmal nocturnal dyspnea No lower extremity edema   Pertinent labs Lab Results  Component Value Date   CHOL 115 12/29/2021   HDL 35 (L) 12/29/2021   LDLCALC 64 12/29/2021   TRIG 77 12/29/2021   CHOLHDL 3.3 12/29/2021   Lab Results  Component Value Date   NA 138 12/29/2021   K 3.9 12/29/2021   CREATININE 1.09 12/29/2021   EGFR 81 12/29/2021   GLUCOSE 104 (H) 12/29/2021   TSH 1.960 11/21/2020     The ASCVD Risk score (Arnett DK, et al., 2019) failed to calculate for the following reasons:   The valid total cholesterol range is 130 to 320 mg/dL  ---------------------------------------------------------------------------------------------------    Medications: Outpatient Medications Prior to Visit  Medication Sig   atorvastatin (LIPITOR) 20 MG tablet Take 1 tablet (20 mg total) by mouth daily.   Coenzyme Q10 (CO Q 10 PO) Take by mouth.   hydrochlorothiazide (HYDRODIURIL) 25 MG tablet Take 1 tablet (25 mg total) by mouth daily. Please schedule an office visit before anymore refills.   KRILL OIL PO Take by mouth.   lisinopril (ZESTRIL) 40 MG tablet Take 1 tablet (40 mg total) by mouth daily.   No facility-administered medications prior to visit.    Review of Systems  Constitutional:  Negative for fatigue and fever.  Respiratory:  Negative for cough and shortness of breath.   Cardiovascular:  Negative for chest pain, palpitations and leg swelling.  Neurological:  Negative for dizziness and headaches.      Objective    Blood pressure (!) 159/77, pulse 88, temperature (!) 97.5 F (36.4 C), temperature source Oral, weight 294 lb 8 oz (133.6 kg), SpO2 99 %.   Physical Exam Vitals reviewed.  Constitutional:      Appearance: He is not ill-appearing.  HENT:     Head: Normocephalic.  Eyes:     Conjunctiva/sclera: Conjunctivae normal.  Cardiovascular:     Rate and Rhythm: Normal rate.  Pulmonary:     Effort: Pulmonary effort is normal. No respiratory distress.  Neurological:     General: No focal deficit present.     Mental Status: He  is alert and oriented to person, place, and time.  Psychiatric:        Mood and Affect: Mood normal.        Behavior: Behavior normal.      No results found for any visits on 03/12/22.  Assessment & Plan     Problem List Items Addressed This Visit       Cardiovascular and Mediastinum   Essential (primary) hypertension - Primary    Chronic,still uncontrolled. Managed on lisinopril 40, hctz 25 mg. Last visit discussed monitoring at home, diet changes, exercise.  Discussed adding either amlodipine or switching hctz to maxide  Pt will see vascular today Will contact pt with  recommendations       Return in about 8 weeks (around 05/07/2022) for hypertension.      I, [name], PA-C have reviewed all documentation for this visit. The documentation on  03/12/2022  for the exam, diagnosis, procedures, and orders are all accurate and complete.  [name], PA-C [facility] [address] [place], [place], [address] Office: [phone] Fax: [place]

## 2022-03-13 NOTE — Telephone Encounter (Signed)
Patient aware. States he will get from pharmacy today after work

## 2022-04-27 DIAGNOSIS — M1712 Unilateral primary osteoarthritis, left knee: Secondary | ICD-10-CM | POA: Diagnosis not present

## 2022-04-27 DIAGNOSIS — S8392XA Sprain of unspecified site of left knee, initial encounter: Secondary | ICD-10-CM | POA: Diagnosis not present

## 2022-05-07 DIAGNOSIS — S8392XA Sprain of unspecified site of left knee, initial encounter: Secondary | ICD-10-CM | POA: Diagnosis not present

## 2022-05-07 DIAGNOSIS — M1712 Unilateral primary osteoarthritis, left knee: Secondary | ICD-10-CM | POA: Diagnosis not present

## 2022-06-23 ENCOUNTER — Other Ambulatory Visit: Payer: Self-pay | Admitting: Physician Assistant

## 2022-06-23 DIAGNOSIS — E782 Mixed hyperlipidemia: Secondary | ICD-10-CM

## 2022-06-24 ENCOUNTER — Other Ambulatory Visit: Payer: Self-pay | Admitting: Physician Assistant

## 2022-06-24 DIAGNOSIS — I10 Essential (primary) hypertension: Secondary | ICD-10-CM

## 2022-06-25 NOTE — Telephone Encounter (Signed)
Requested Prescriptions  Pending Prescriptions Disp Refills   hydrochlorothiazide (HYDRODIURIL) 25 MG tablet [Pharmacy Med Name: HYDROCHLOROTHIAZIDE 25 MG TAB] 90 tablet 0    Sig: TAKE 1 TABLET (25 MG TOTAL) BY MOUTH DAILY. PLEASE SCHEDULE AN OFFICE VISIT BEFORE ANYMORE REFILLS.     Cardiovascular: Diuretics - Thiazide Failed - 06/24/2022  1:10 AM      Failed - Last BP in normal range    BP Readings from Last 1 Encounters:  03/12/22 (!) 163/94         Passed - Cr in normal range and within 180 days    Creatinine, Ser  Date Value Ref Range Status  12/29/2021 1.09 0.76 - 1.27 mg/dL Final         Passed - K in normal range and within 180 days    Potassium  Date Value Ref Range Status  12/29/2021 3.9 3.5 - 5.2 mmol/L Final         Passed - Na in normal range and within 180 days    Sodium  Date Value Ref Range Status  12/29/2021 138 134 - 144 mmol/L Final         Passed - Valid encounter within last 6 months    Recent Outpatient Visits           3 months ago Essential (primary) hypertension   [place] [name], PA-C   5 months ago Essential (primary) hypertension   [place] Thedore Mins, [place], PA-C   5 months ago Essential (primary) hypertension   [place] [name], PA-C   1 year ago Essential (primary) hypertension   [place] Chrismon, Vickki Muff, PA-C   3 years ago Annual physical exam   [place] Chrismon, Vickki Muff, [place]

## 2022-06-25 NOTE — Telephone Encounter (Signed)
Requested Prescriptions  Pending Prescriptions Disp Refills   atorvastatin (LIPITOR) 20 MG tablet [Pharmacy Med Name: ATORVASTATIN 20 MG TABLET] 90 tablet 0    Sig: TAKE 1 TABLET BY MOUTH EVERY DAY     Cardiovascular:  Antilipid - Statins Failed - 06/23/2022  1:14 AM      Failed - Lipid Panel in normal range within the last 12 months    Cholesterol, Total  Date Value Ref Range Status  12/29/2021 115 100 - 199 mg/dL Final   LDL Chol Calc (NIH)  Date Value Ref Range Status  12/29/2021 64 0 - 99 mg/dL Final   HDL  Date Value Ref Range Status  12/29/2021 35 (L) >39 mg/dL Final   Triglycerides  Date Value Ref Range Status  12/29/2021 77 0 - 149 mg/dL Final         Passed - Patient is not pregnant      Passed - Valid encounter within last 12 months    Recent Outpatient Visits           3 months ago Essential (primary) hypertension   [place] Thedore Mins, [place], PA-C   5 months ago Essential (primary) hypertension   [place] Thedore Mins, [place], PA-C   5 months ago Essential (primary) hypertension   [place] [name], PA-C   1 year ago Essential (primary) hypertension   [place] Chrismon, Vickki Muff, PA-C   3 years ago Annual physical exam   [place] Chrismon, Vickki Muff, [place]

## 2022-09-22 ENCOUNTER — Other Ambulatory Visit: Payer: Self-pay | Admitting: Physician Assistant

## 2022-09-22 DIAGNOSIS — I10 Essential (primary) hypertension: Secondary | ICD-10-CM

## 2022-09-22 DIAGNOSIS — E782 Mixed hyperlipidemia: Secondary | ICD-10-CM

## 2022-12-25 ENCOUNTER — Other Ambulatory Visit: Payer: Self-pay | Admitting: Physician Assistant

## 2022-12-25 DIAGNOSIS — I10 Essential (primary) hypertension: Secondary | ICD-10-CM

## 2023-01-05 ENCOUNTER — Other Ambulatory Visit: Payer: Self-pay | Admitting: Physician Assistant

## 2023-01-05 DIAGNOSIS — E782 Mixed hyperlipidemia: Secondary | ICD-10-CM

## 2023-01-05 DIAGNOSIS — I10 Essential (primary) hypertension: Secondary | ICD-10-CM

## 2023-03-29 ENCOUNTER — Ambulatory Visit: Payer: BC Managed Care – PPO | Admitting: Family Medicine

## 2023-03-29 VITALS — BP 176/92 | HR 84 | Resp 22 | Ht 70.5 in | Wt 303.0 lb

## 2023-03-29 DIAGNOSIS — U071 COVID-19: Secondary | ICD-10-CM

## 2023-03-29 DIAGNOSIS — R059 Cough, unspecified: Secondary | ICD-10-CM | POA: Diagnosis not present

## 2023-03-29 DIAGNOSIS — I10 Essential (primary) hypertension: Secondary | ICD-10-CM | POA: Diagnosis not present

## 2023-03-29 LAB — POC COVID19/FLU A&B COMBO
Covid Antigen, POC: POSITIVE — AB
Influenza A Antigen, POC: NEGATIVE
Influenza B Antigen, POC: NEGATIVE

## 2023-03-29 MED ORDER — VALSARTAN 80 MG PO TABS
80.0000 mg | ORAL_TABLET | Freq: Every day | ORAL | 0 refills | Status: DC
Start: 2023-03-29 — End: 2023-05-09

## 2023-03-29 MED ORDER — NIRMATRELVIR/RITONAVIR (PAXLOVID)TABLET: 3.0000 | ORAL_TABLET | Freq: Two times a day (BID) | ORAL | 0 refills | Status: AC

## 2023-03-29 NOTE — Patient Instructions (Signed)
Take 

## 2023-03-29 NOTE — Progress Notes (Signed)
 Established patient visit   Patient: Travis D Merlino Jr.   DOB: 02/01/1966   58 y.o. Male  MRN: [id number] Visit Date: 03/29/2023  Today's healthcare provider: Nancyann Perry, MD   Chief Complaint  Patient presents with   Cough    Started Tuesday, wife COVID positive   Nasal Congestion    Pressure in head keeps him awake unless he sleeps at 45 angle   Subjective    Discussed the use of AI scribe software for clinical note transcription with the patient, who gave verbal consent to proceed.  History of Present Illness   The patient, with a history of hypertension, presents with URI symptoms. He reports onset of symptoms 03/26/23, describing them as similar to a common cold, with occasional chills but no fever. The patient's spouse has also tested positive for COVID-19 and has been experiencing more severe symptoms including fever and chills. He has been managing his current symptoms with over-the-counter coracetin, but finds it insufficient for his head pressure and congestion.  The patient has previously tested positive for COVID-19 in 2020, but was asymptomatic at that time. Currently, he denies any significant respiratory distress, but notes difficulty in lying flat for extended periods, necessitating sleeping at a 45-degree angle on the couch. He also reports a sensation of pressure in his head, particularly when bending over.  The patient has been out of his lisinopril  40 medication for approximately a month, which he believes has contributed to his elevated blood pressure. He expresses interest in exploring alternative antihypertensive medications due to perceived side effects from Lisinopril .       Medications: Outpatient Medications Prior to Visit  Medication Sig   amLODipine  (NORVASC ) 5 MG tablet Take 1 tablet (5 mg total) by mouth daily.   atorvastatin  (LIPITOR) 20 MG tablet TAKE 1 TABLET BY MOUTH EVERY DAY   Coenzyme Q10 (CO Q 10 PO) Take by mouth.   hydrochlorothiazide   (HYDRODIURIL ) 25 MG tablet TAKE 1 TABLET (25 MG TOTAL) BY MOUTH DAILY.   KRILL OIL PO Take by mouth.   [DISCONTINUED] lisinopril  (ZESTRIL ) 40 MG tablet TAKE 1 TABLET BY MOUTH EVERY DAY (Patient not taking: Reported on 03/29/2023)   No facility-administered medications prior to visit.   Review of Systems     Objective    BP (!) 176/92   Pulse 84   Resp (!) 22   Ht 5' 10.5 (1.791 m)   Wt (!) 303 lb (137.4 kg)   SpO2 99%   BMI 42.86 kg/m   Physical Exam   General Appearance:    Obese male, alert, cooperative, in no acute distress  HENT:   neck without nodes, pharynx erythematous without exudate,  sinus tender, and nasal mucosa congested  Eyes:    PERRL, conjunctiva/corneas clear, EOM's intact       Lungs:     Clear to auscultation bilaterally, respirations unlabored  Heart:    Normal heart rate. Normal rhythm. No murmurs, rubs, or gallops.    Neurologic:   Awake, alert, oriented x 3. No apparent focal neurological           defect.          Results for orders placed or performed in visit on 03/29/23  POC Covid19/Flu A&B Antigen  Result Value Ref Range   Influenza A Antigen, POC Negative Negative   Influenza B Antigen, POC Negative Negative   Covid Antigen, POC Positive (A) Negative    Assessment & Plan  COVID-19 Positive test with symptoms since Tuesday. No fever, chills, or significant respiratory symptoms. Difficulty lying flat due to congestion. No wheezing or crackles on exam. -Counseled that Paxlovid  is not likely prevent serious disease or hospitalization, but may reduce duration and severity of sx. Given printed rx to fill in the next day of two if he decides to take it.  -Use over-the-counter decongestant spray like Afrin 2-3 times a day for a few days. -Take ibuprofen for headache.  Hypertension Off Lisinopril  since November 18th, leading to elevated blood pressure. Patient reports potential side effects from Lisinopril . -Discontinue Lisinopril . -Start  Valsartan  80mg , continue amlodipine  and hctz -Schedule CPE and follow-up appointment in February to assess blood pressure control and tolerance of new medication.  Counseled on return to work recommendations   Return in about 1 month (around 04/29/2023) for Yearly Physical.      Nancyann Perry, MD  [facility] [phone] (phone) [phone] (fax)  [facility]

## 2023-05-01 ENCOUNTER — Encounter: Payer: Self-pay | Admitting: Family Medicine

## 2023-05-01 ENCOUNTER — Ambulatory Visit: Payer: BC Managed Care – PPO | Admitting: Family Medicine

## 2023-05-01 VITALS — BP 160/84 | HR 71 | Ht 70.0 in | Wt 297.0 lb

## 2023-05-01 DIAGNOSIS — R5383 Other fatigue: Secondary | ICD-10-CM

## 2023-05-01 DIAGNOSIS — Z0000 Encounter for general adult medical examination without abnormal findings: Secondary | ICD-10-CM

## 2023-05-01 DIAGNOSIS — Z125 Encounter for screening for malignant neoplasm of prostate: Secondary | ICD-10-CM

## 2023-05-01 DIAGNOSIS — E782 Mixed hyperlipidemia: Secondary | ICD-10-CM

## 2023-05-01 DIAGNOSIS — Z1211 Encounter for screening for malignant neoplasm of colon: Secondary | ICD-10-CM

## 2023-05-01 DIAGNOSIS — Z23 Encounter for immunization: Secondary | ICD-10-CM | POA: Diagnosis not present

## 2023-05-01 DIAGNOSIS — Z0001 Encounter for general adult medical examination with abnormal findings: Secondary | ICD-10-CM

## 2023-05-01 DIAGNOSIS — I10 Essential (primary) hypertension: Secondary | ICD-10-CM | POA: Diagnosis not present

## 2023-05-01 NOTE — Progress Notes (Signed)
 Complete physical exam   Patient: Travis D Colello Jr.   DOB: January 07, 1966   57 y.o. Male  MRN: [id number] Visit Date: 05/01/2023  Today's healthcare provider: Nancyann Perry, MD   Chief Complaint  Patient presents with   Annual Exam    Last completed 06/05/2019 Diet - General, well balanced Exercise - Walking at work but nothing structured outside of that Feeling - well  Sleeping - well Concerns - none   Immunizations    NO record in [facility] Shingles - would like to discuss Tetanus - consent form provided Influenza - declined   Care Management    Colonoscopy - would like to further discuss with provider Cologuard - would like to further discuss with provider   Medication Reaction    Reports stopped taking amlodipine  1 month after it was prescribed due to having every side effect listed with medication   Subjective    Travis D Mcgrail Jr. is a 58 y.o. male who presents today for a complete physical exam.  He reports consuming a low sodium diet.  He generally feels well. He reports sleeping well. He does not have additional problems to discuss today.  HPI Was changed from lisinopril  to valsartan  last month due to persistent cough which has since resolved. Previously tried amlodipine  which he was intolerant to. Taking atorvastatin  consistently for lipids and tolerating without side effects.  Lab Results  Component Value Date   CHOL 115 12/29/2021   HDL 35 (L) 12/29/2021   LDLCALC 64 12/29/2021   TRIG 77 12/29/2021   CHOLHDL 3.3 12/29/2021   Lab Results  Component Value Date   NA 138 12/29/2021   K 3.9 12/29/2021   CREATININE 1.09 12/29/2021   EGFR 81 12/29/2021   GLUCOSE 104 (H) 12/29/2021   He does states he feels like he is more tired than and should be and wondering if his testosterone  levels are low.   Past Medical History:  Diagnosis Date   Hypertension    Past Surgical History:  Procedure Laterality Date   NO PAST SURGERIES     Social History   Socioeconomic  History   Marital status: Married    Spouse name: Not on file   Number of children: Not on file   Years of education: Not on file   Highest education level: Not on file  Occupational History   Not on file  Tobacco Use   Smoking status: Never   Smokeless tobacco: Never  Vaping Use   Vaping status: Never Used  Substance and Sexual Activity   Alcohol use: Yes    Alcohol/week: 0.0 standard drinks of alcohol    Comment: occasionally- drinks twice a day and drinks 4-6 beers   Drug use: No   Sexual activity: Not on file  Other Topics Concern   Not on file  Social History Narrative   Not on file   Social Drivers of Health   Financial Resource Strain: Not on file  Food Insecurity: Not on file  Transportation Needs: Not on file  Physical Activity: Not on file  Stress: Not on file  Social Connections: Not on file  Intimate Partner Violence: Not on file   Family Status  Relation Name Status   Mother  Deceased   Father  Alive   MGM  Deceased   MGF  Deceased   PGM  Deceased   PGF  Deceased  No partnership data on file   Family History  Problem Relation Age of Onset  Cancer Mother    Cancer Maternal Grandmother    Cancer Maternal Grandfather    Cancer Paternal Grandmother    Allergies  Allergen Reactions   Amlodipine      Patient Care Team: Gasper Nancyann BRAVO, MD as PCP - General (Family Medicine)   Medications: Outpatient Medications Prior to Visit  Medication Sig   atorvastatin  (LIPITOR) 20 MG tablet TAKE 1 TABLET BY MOUTH EVERY DAY   Coenzyme Q10 (CO Q 10 PO) Take by mouth.   hydrochlorothiazide  (HYDRODIURIL ) 25 MG tablet TAKE 1 TABLET (25 MG TOTAL) BY MOUTH DAILY.   KRILL OIL PO Take by mouth.   valsartan  (DIOVAN ) 80 MG tablet Take 1 tablet (80 mg total) by mouth daily.   [DISCONTINUED] amLODipine  (NORVASC ) 5 MG tablet Take 1 tablet (5 mg total) by mouth daily. (Patient not taking: Reported on 05/01/2023)   No facility-administered medications prior to visit.     Review of Systems  Constitutional:  Negative for appetite change, chills and fever.  Respiratory:  Negative for chest tightness, shortness of breath and wheezing.   Cardiovascular:  Negative for chest pain and palpitations.  Gastrointestinal:  Negative for abdominal pain, nausea and vomiting.      Objective    BP (!) 160/84 (BP Location: Right Arm, Patient Position: Sitting, Cuff Size: Large)   Pulse 71   Ht 5' 10 (1.778 m)   Wt 297 lb (134.7 kg)   BMI 42.62 kg/m    Physical Exam   General Appearance:    Obese male. Alert, cooperative, in no acute distress, appears stated age  Head:    Normocephalic, without obvious abnormality, atraumatic  Eyes:    PERRL, conjunctiva/corneas clear, EOM's intact, fundi    benign, both eyes       Ears:    Normal TM's and external ear canals, both ears  Nose:   Nares normal, septum midline, mucosa normal, no drainage   or sinus tenderness  Throat:   Lips, mucosa, and tongue normal; teeth and gums normal  Neck:   Supple, symmetrical, trachea midline, no adenopathy;       thyroid :  No enlargement/tenderness/nodules; no carotid   bruit or JVD  Back:     Symmetric, no curvature, ROM normal, no CVA tenderness  Lungs:     Clear to auscultation bilaterally, respirations unlabored  Chest wall:    No tenderness or deformity  Heart:    Normal heart rate. Normal rhythm. No murmurs, rubs, or gallops.  S1 and S2 normal  Abdomen:     Soft, non-tender, bowel sounds active all four quadrants,    no masses, no organomegaly  Genitalia:    deferred  Rectal:    deferred  Extremities:   All extremities are intact. No cyanosis or edema  Pulses:   2+ and symmetric all extremities  Skin:   Skin color, texture, turgor normal, no rashes or lesions  Lymph nodes:   Cervical, supraclavicular, and axillary nodes normal  Neurologic:   CNII-XII intact. Normal strength, sensation and reflexes      throughout     Last depression screening scores    05/01/2023     8:30 AM 03/29/2023   10:12 AM 11/17/2020    9:57 AM  PHQ 2/9 Scores  PHQ - 2 Score 0 0 0  PHQ- 9 Score 0 0 0   Last fall risk screening    05/01/2023    8:30 AM  Fall Risk   Falls in the past year? 0  Number  falls in past yr: 0  Injury with Fall? 0  Risk for fall due to : No Fall Risks  Follow up Falls evaluation completed   Last Audit-C alcohol use screening    11/17/2020    9:56 AM  Alcohol Use Disorder Test (AUDIT)  1. How often do you have a drink containing alcohol? 2  2. How many drinks containing alcohol do you have on a typical day when you are drinking? 0  3. How often do you have six or more drinks on one occasion? 0  AUDIT-C Score 2   A score of 3 or more in women, and 4 or more in men indicates increased risk for alcohol abuse, EXCEPT if all of the points are from question 1   No results found for any visits on 05/01/23.  Assessment & Plan    Routine Health Maintenance and Physical Exam  Exercise Activities and Dietary recommendations  Goals   None     Immunization History  Administered Date(s) Administered   Tdap 09/23/2008, 05/01/2023   Zoster Recombinant(Shingrix ) 05/01/2023    Health Maintenance  Topic Date Due   Colonoscopy  Never done   COVID-19 Vaccine (1 - 2024-25 season) Never done   INFLUENZA VACCINE  06/24/2023 (Originally 10/25/2022)   Zoster Vaccines- Shingrix  (2 of 2) 06/26/2023   DTaP/Tdap/Td (3 - Td or Tdap) 04/30/2033   Hepatitis C Screening  Completed   HIV Screening  Completed   HPV VACCINES  Aged Out    Discussed health benefits of physical activity, and encouraged him to engage in regular exercise appropriate for his age and condition.   2. Prostate cancer screening  - PSA Total (Reflex To Free)  3. Colon cancer screening  - Ambulatory referral to Gastroenterology  4. Need for tetanus, diphtheria, and acellular pertussis (Tdap) vaccine in patient of adolescent age or older  - Administer Tetanus-diphtheria-acellular  pertussis (Tdap) vaccine  5. Need for shingles vaccine  - Administer Zoster, Recombinant (Shingrix ) Vaccine #1  6. Other fatigue  - Testosterone  - TSH  7. Essential (primary) hypertension Tolerating initiation of valsartan  well. Anticipate increase to 160 or adding hydrochlorothiazide  after reviewing labs.   8. Mixed hyperlipidemia He is tolerating atorvastatin  well with no adverse effects.   - CBC - Lipid panel - Comprehensive metabolic panel  - Lipoprotein A (LPA) - Apolipoprotein B      Nancyann Perry, MD  [facility] [phone] (phone) [phone] (fax)  [facility]

## 2023-05-01 NOTE — Patient Instructions (Signed)
 Please review the attached list of medications and notify my office if there are any errors.   Please go to the lab draw station in [address] on the second floor of [facility]  when you are fasting for 8 hours. Normal hours are 8:00am to 11:30am and 1:00pm to 4:00pm Monday through Friday   Please contact your eyecare professional to schedule a routine eye exam   Call [facility] at [phone] to schedule a screening colonoscopy

## 2023-05-02 DIAGNOSIS — R5383 Other fatigue: Secondary | ICD-10-CM | POA: Diagnosis not present

## 2023-05-02 DIAGNOSIS — Z125 Encounter for screening for malignant neoplasm of prostate: Secondary | ICD-10-CM | POA: Diagnosis not present

## 2023-05-02 DIAGNOSIS — I10 Essential (primary) hypertension: Secondary | ICD-10-CM | POA: Diagnosis not present

## 2023-05-02 DIAGNOSIS — E782 Mixed hyperlipidemia: Secondary | ICD-10-CM | POA: Diagnosis not present

## 2023-05-03 LAB — CBC
Hematocrit: 40.7 % (ref 37.5–51.0)
Hemoglobin: 13.2 g/dL (ref 13.0–17.7)
MCH: 27 pg (ref 26.6–33.0)
MCHC: 32.4 g/dL (ref 31.5–35.7)
MCV: 83 fL (ref 79–97)
Platelets: 213 10*3/uL (ref 150–450)
RBC: 4.88 x10E6/uL (ref 4.14–5.80)
RDW: 13.1 % (ref 11.6–15.4)
WBC: 6.3 10*3/uL (ref 3.4–10.8)

## 2023-05-03 LAB — COMPREHENSIVE METABOLIC PANEL
ALT: 19 [IU]/L (ref 0–44)
AST: 18 [IU]/L (ref 0–40)
Albumin: 3.9 g/dL (ref 3.8–4.9)
Alkaline Phosphatase: 83 [IU]/L (ref 44–121)
BUN/Creatinine Ratio: 14 (ref 9–20)
BUN: 14 mg/dL (ref 6–24)
Bilirubin Total: 0.8 mg/dL (ref 0.0–1.2)
CO2: 26 mmol/L (ref 20–29)
Calcium: 9 mg/dL (ref 8.7–10.2)
Chloride: 100 mmol/L (ref 96–106)
Creatinine, Ser: 1.02 mg/dL (ref 0.76–1.27)
Globulin, Total: 3.1 g/dL (ref 1.5–4.5)
Glucose: 113 mg/dL — ABNORMAL HIGH (ref 70–99)
Potassium: 4.4 mmol/L (ref 3.5–5.2)
Sodium: 141 mmol/L (ref 134–144)
Total Protein: 7 g/dL (ref 6.0–8.5)
eGFR: 87 mL/min/{1.73_m2} (ref >59)

## 2023-05-03 LAB — APOLIPOPROTEIN B: Apolipoprotein B: 67 mg/dL (ref <90)

## 2023-05-03 LAB — TESTOSTERONE: Testosterone: 143 ng/dL — ABNORMAL LOW (ref 264–916)

## 2023-05-03 LAB — LIPID PANEL
Chol/HDL Ratio: 3.9 {ratio} (ref 0.0–5.0)
Cholesterol, Total: 120 mg/dL (ref 100–199)
HDL: 31 mg/dL — ABNORMAL LOW (ref >39)
LDL Chol Calc (NIH): 72 mg/dL (ref 0–99)
Triglycerides: 87 mg/dL (ref 0–149)
VLDL Cholesterol Cal: 17 mg/dL (ref 5–40)

## 2023-05-03 LAB — PSA TOTAL (REFLEX TO FREE): Prostate Specific Ag, Serum: 0.6 ng/mL (ref 0.0–4.0)

## 2023-05-03 LAB — LIPOPROTEIN A (LPA): Lipoprotein (a): 150 nmol/L — ABNORMAL HIGH (ref <75.0)

## 2023-05-03 LAB — TSH: TSH: 1.97 u[IU]/mL (ref 0.450–4.500)

## 2023-05-07 ENCOUNTER — Encounter: Payer: Self-pay | Admitting: Family Medicine

## 2023-05-07 ENCOUNTER — Other Ambulatory Visit: Payer: Self-pay | Admitting: Family Medicine

## 2023-05-07 DIAGNOSIS — E349 Endocrine disorder, unspecified: Secondary | ICD-10-CM

## 2023-05-07 DIAGNOSIS — E7841 Elevated Lipoprotein(a): Secondary | ICD-10-CM | POA: Insufficient documentation

## 2023-05-08 DIAGNOSIS — E349 Endocrine disorder, unspecified: Secondary | ICD-10-CM | POA: Diagnosis not present

## 2023-05-09 ENCOUNTER — Encounter: Payer: Self-pay | Admitting: Family Medicine

## 2023-05-09 ENCOUNTER — Other Ambulatory Visit: Payer: Self-pay | Admitting: Family Medicine

## 2023-05-09 DIAGNOSIS — E349 Endocrine disorder, unspecified: Secondary | ICD-10-CM

## 2023-05-09 DIAGNOSIS — I10 Essential (primary) hypertension: Secondary | ICD-10-CM

## 2023-05-09 MED ORDER — ANDRODERM 4 MG/24HR TD PT24: 1.0000 | MEDICATED_PATCH | Freq: Every day | TRANSDERMAL | 1 refills | Status: DC

## 2023-05-09 MED ORDER — VALSARTAN 160 MG PO TABS: 160.0000 mg | ORAL_TABLET | Freq: Every day | ORAL | 1 refills | Status: DC

## 2023-05-12 LAB — TESTOSTERONE,FREE AND TOTAL
Testosterone, Free: 5.2 pg/mL — ABNORMAL LOW (ref 7.2–24.0)
Testosterone: 256 ng/dL — ABNORMAL LOW (ref 264–916)

## 2023-05-13 ENCOUNTER — Encounter: Payer: Self-pay | Admitting: *Deleted

## 2023-06-06 ENCOUNTER — Other Ambulatory Visit: Payer: Self-pay | Admitting: Family Medicine

## 2023-06-06 DIAGNOSIS — I10 Essential (primary) hypertension: Secondary | ICD-10-CM

## 2023-06-06 DIAGNOSIS — E782 Mixed hyperlipidemia: Secondary | ICD-10-CM

## 2023-06-06 NOTE — Telephone Encounter (Unsigned)
 Copied from CRM [id number]. Topic: Clinical - Medication Refill >> Jun 06, 2023 12:07 PM [name] wrote: Most Recent Primary Care Visit:  Provider: Malva Limes  Department: [facility]  Visit Type: PHYSICAL  Date: 05/01/2023  Medication:  atorvastatin (LIPITOR) 20 MG tablet hydrochlorothiazide (HYDRODIURIL) 25 MG tablet  Has the patient contacted their pharmacy? No (Agent: If no, request that the patient contact the pharmacy for the refill. If patient does not wish to contact the pharmacy document the reason why and proceed with request.) (Agent: If yes, when and what did the pharmacy advise?)  Is this the correct pharmacy for this prescription? Yes If no, delete pharmacy and type the correct one.  This is the patient's preferred pharmacy:  [id number] - [place], [place] - [address] [place] [address] Phone: [phone] Fax: [phone]   Has the prescription been filled recently? Yes  Is the patient out of the medication? Yes  Has the patient been seen for an appointment in the last year OR does the patient have an upcoming appointment? Yes  Can we respond through MyChart? Yes  Agent: Please be advised that Rx refills may take up to 3 business days. We ask that you follow-up with your pharmacy.

## 2023-06-06 NOTE — Telephone Encounter (Signed)
 Copied from CRM [id number]. Topic: Clinical - Medication Refill >> Jun 06, 2023 12:07 PM [name] wrote: Most Recent Primary Care Visit:  Provider: Malva Limes  Department: [facility]  Visit Type: PHYSICAL  Date: 05/01/2023  Medication:  atorvastatin (LIPITOR) 20 MG tablet hydrochlorothiazide (HYDRODIURIL) 25 MG tablet  Has the patient contacted their pharmacy? No (Agent: If no, request that the patient contact the pharmacy for the refill. If patient does not wish to contact the pharmacy document the reason why and proceed with request.) (Agent: If yes, when and what did the pharmacy advise?)  Is this the correct pharmacy for this prescription? Yes If no, delete pharmacy and type the correct one.  This is the patient's preferred pharmacy:  [id number] - [place], [place] - [address] [place] [address] Phone: [phone] Fax: [phone]   Has the prescription been filled recently? Yes  Is the patient out of the medication? Yes  Has the patient been seen for an appointment in the last year OR does the patient have an upcoming appointment? Yes  Can we respond through MyChart? Yes  Agent: Please be advised that Rx refills may take up to 3 business days. We ask that you follow-up with your pharmacy.

## 2023-06-07 MED ORDER — HYDROCHLOROTHIAZIDE 25 MG PO TABS: 25.0000 mg | ORAL_TABLET | Freq: Every day | ORAL | 3 refills | Status: AC

## 2023-06-07 MED ORDER — ATORVASTATIN CALCIUM 20 MG PO TABS: 20.0000 mg | ORAL_TABLET | Freq: Every day | ORAL | 3 refills | Status: AC

## 2023-06-07 NOTE — Telephone Encounter (Signed)
 Requested Prescriptions  Pending Prescriptions Disp Refills   atorvastatin (LIPITOR) 20 MG tablet 90 tablet 3    Sig: Take 1 tablet (20 mg total) by mouth daily.     Cardiovascular:  Antilipid - Statins Failed - 06/07/2023  8:49 AM      Failed - Lipid Panel in normal range within the last 12 months    Cholesterol, Total  Date Value Ref Range Status  05/02/2023 120 100 - 199 mg/dL Final   LDL Chol Calc (NIH)  Date Value Ref Range Status  05/02/2023 72 0 - 99 mg/dL Final   HDL  Date Value Ref Range Status  05/02/2023 31 (L) >39 mg/dL Final   Triglycerides  Date Value Ref Range Status  05/02/2023 87 0 - 149 mg/dL Final         Passed - Patient is not pregnant      Passed - Valid encounter within last 12 months    Recent Outpatient Visits           2 months ago Cough, unspecified type   [facility] Malva Limes, MD   1 year ago Essential (primary) hypertension   [facility] Alfredia Ferguson, PA-C   1 year ago Essential (primary) hypertension   [facility] Alfredia Ferguson, PA-C   1 year ago Essential (primary) hypertension   [facility] Alfredia Ferguson, PA-C   2 years ago Essential (primary) hypertension   [place] Chrismon, Jodell Cipro, PA-C       Future Appointments             In 11 months Fisher, Demetrios Isaacs, MD [facility]             hydrochlorothiazide (HYDRODIURIL) 25 MG tablet 90 tablet 3    Sig: Take 1 tablet (25 mg total) by mouth daily.     Cardiovascular: Diuretics - Thiazide Failed - 06/07/2023  8:49 AM      Failed - Last BP in normal range    BP Readings from Last 1 Encounters:  05/01/23 (!) 160/84         Passed - Cr in normal range and within 180 days    Creatinine, Ser  Date Value Ref Range Status  05/02/2023 1.02 0.76 - 1.27 mg/dL Final         Passed - K in normal range  and within 180 days    Potassium  Date Value Ref Range Status  05/02/2023 4.4 3.5 - 5.2 mmol/L Final         Passed - Na in normal range and within 180 days    Sodium  Date Value Ref Range Status  05/02/2023 141 134 - 144 mmol/L Final         Passed - Valid encounter within last 6 months    Recent Outpatient Visits           2 months ago Cough, unspecified type   [facility] Malva Limes, MD   1 year ago Essential (primary) hypertension   [facility] Alfredia Ferguson, PA-C   1 year ago Essential (primary) hypertension   [facility] Alfredia Ferguson, PA-C   1 year ago Essential (primary) hypertension   [place] [facility] Alfredia Ferguson, PA-C   2 years ago Essential (primary) hypertension    [facility]  Chrismon, Jodell Cipro, PA-C       Future Appointments             In 11 months Fisher, Demetrios Isaacs, MD [facility]

## 2023-06-24 ENCOUNTER — Other Ambulatory Visit: Payer: Self-pay | Admitting: Family Medicine

## 2023-10-24 ENCOUNTER — Other Ambulatory Visit: Payer: Self-pay

## 2023-10-24 DIAGNOSIS — E349 Endocrine disorder, unspecified: Secondary | ICD-10-CM

## 2023-10-24 NOTE — Telephone Encounter (Signed)
 Copied from CRM [id number]. Topic: General - Other >> Jun 06, 2023 12:03 PM Travis Tanner wrote: Patient called to get an update on the prior authorization for his testosterone  patches. Please advise.

## 2023-10-25 NOTE — Telephone Encounter (Signed)
 Is this regarding the prescription that was sent in February? If so then I will need to send in another prescription to restart the PA process.

## 2023-10-28 NOTE — Telephone Encounter (Signed)
 Spoke with patient, informed me that he never received these back in Feb when they were originally ordered. Patient would like to proceed with getting these- will send to another provider in office since Dr. Gasper is out this week.

## 2023-10-29 ENCOUNTER — Telehealth: Payer: Self-pay | Admitting: Pharmacy Technician

## 2023-10-29 ENCOUNTER — Encounter: Payer: Self-pay | Admitting: Pharmacy Technician

## 2023-10-29 ENCOUNTER — Other Ambulatory Visit (HOSPITAL_COMMUNITY): Payer: Self-pay

## 2023-10-29 DIAGNOSIS — E349 Endocrine disorder, unspecified: Secondary | ICD-10-CM

## 2023-10-29 MED ORDER — ANDRODERM 4 MG/24HR TD PT24: 1.0000 | MEDICATED_PATCH | Freq: Every day | TRANSDERMAL | 1 refills | Status: DC

## 2023-10-29 NOTE — Telephone Encounter (Signed)
 Pharmacy Patient Advocate Encounter   Received notification from [employer] [facility] that prior authorization for Testosterone  patche is required/requested.   Insurance verification completed.   The patient is insured through [employer].   Per test claim: Androderm  and it's generics have been discontinued.  Testosterone  injections and  Generic androgel  pump Testosterone  20.25mg /ACT (1.62%) TD Gel may be covered with a PA. If suggested medication is appropriate, Please send in a new RX and discontinue this one. Please note, these medications still require a PA.

## 2023-10-29 NOTE — Telephone Encounter (Signed)
 A user error has taken place: encounter opened in error, closed for administrative reasons.

## 2023-10-29 NOTE — Telephone Encounter (Signed)
 PA request has been Received. New Encounter has been or will be created for follow up. For additional info see Pharmacy Prior Auth telephone encounter from 10/29/23.

## 2023-10-30 MED ORDER — TESTOSTERONE 20.25 MG/ACT (1.62%) TD GEL: TRANSDERMAL | 1 refills | Status: DC

## 2023-11-05 ENCOUNTER — Other Ambulatory Visit: Payer: Self-pay | Admitting: Family Medicine

## 2023-11-05 DIAGNOSIS — I10 Essential (primary) hypertension: Secondary | ICD-10-CM

## 2024-02-01 ENCOUNTER — Other Ambulatory Visit: Payer: Self-pay | Admitting: Family Medicine

## 2024-02-01 DIAGNOSIS — I10 Essential (primary) hypertension: Secondary | ICD-10-CM

## 2024-02-08 ENCOUNTER — Other Ambulatory Visit: Payer: Self-pay | Admitting: Family Medicine

## 2024-02-08 DIAGNOSIS — E349 Endocrine disorder, unspecified: Secondary | ICD-10-CM

## 2024-02-13 ENCOUNTER — Other Ambulatory Visit (HOSPITAL_COMMUNITY): Payer: Self-pay

## 2024-02-13 ENCOUNTER — Telehealth: Payer: Self-pay | Admitting: Pharmacy Technician

## 2024-02-13 NOTE — Telephone Encounter (Signed)
 Pharmacy Patient Advocate Encounter   Received notification from Onbase that prior authorization for Testosterone  20.25mg /ACT (1.62%) gel is required/requested.   Insurance verification completed.   The patient is insured through [facility].   Per test claim: PA required; However, NEW/RECENT labs/notes are needed to complete & submit PA request.  Pt paid cash for this med in August and Sept. In a sense, this is a continuation, but since the ins hasn't paid for it before, they are viewing it as an initial review. Since he hasn't been seen since February, I don't have anything to support if the T is helping or not, etc. The PA questionnaire doesn't tell me what meds are preferred, but it's wanting clinical rational for not having tried any alternatives.  Just let us  know if the PA is needed or if he is going to continue paying cash for this medication.

## 2024-02-13 NOTE — Telephone Encounter (Signed)
PA completed and submitted.

## 2024-02-18 ENCOUNTER — Other Ambulatory Visit (HOSPITAL_COMMUNITY): Payer: Self-pay

## 2024-02-18 NOTE — Telephone Encounter (Signed)
 Pharmacy Patient Advocate Encounter  Received notification from [facility] that Prior Authorization for Testosterone  20.25mg /ACT (1.62%) ge; has been APPROVED from 02/16/24 to 02/12/25. Ran test claim, Copay is $20. This test claim was processed through [facility]- copay amounts may vary at other pharmacies due to pharmacy/plan contracts, or as the patient moves through the different stages of their insurance plan.   PA #/Case ID/Reference #: [id number]

## 2024-05-06 ENCOUNTER — Encounter: Payer: BC Managed Care – PPO | Admitting: Family Medicine
